# Patient Record
Sex: Female | Born: 2000 | Race: Black or African American | Hispanic: No | Marital: Single | Smoking: Never smoker
Health system: Southern US, Community
[De-identification: ages and names within clinical notes are randomized; demographics above are authoritative.]

## PROBLEM LIST (undated history)

## (undated) DIAGNOSIS — N390 Urinary tract infection, site not specified: Secondary | ICD-10-CM

## (undated) DIAGNOSIS — E079 Disorder of thyroid, unspecified: Secondary | ICD-10-CM

## (undated) DIAGNOSIS — Z8669 Personal history of other diseases of the nervous system and sense organs: Secondary | ICD-10-CM

## (undated) DIAGNOSIS — J302 Other seasonal allergic rhinitis: Secondary | ICD-10-CM

## (undated) HISTORY — DX: Disorder of thyroid, unspecified: E07.9

## (undated) HISTORY — DX: Urinary tract infection, site not specified: N39.0

## (undated) HISTORY — PX: TYMPANOSTOMY TUBE PLACEMENT: SHX32

## (undated) HISTORY — DX: Personal history of other diseases of the nervous system and sense organs: Z86.69

## (undated) HISTORY — PX: TONSILLECTOMY AND ADENOIDECTOMY: SUR1326

## (undated) HISTORY — DX: Other seasonal allergic rhinitis: J30.2

## (undated) HISTORY — PX: HERNIA REPAIR: SHX51

---
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
## 2019-03-31 ENCOUNTER — Other Ambulatory Visit (INDEPENDENT_AMBULATORY_CARE_PROVIDER_SITE_OTHER): Payer: Self-pay | Admitting: *Deleted

## 2019-03-31 DIAGNOSIS — E063 Autoimmune thyroiditis: Secondary | ICD-10-CM

## 2019-04-01 LAB — HEMOGLOBIN A1C
Hgb A1c MFr Bld: 5.1 % of total Hgb (ref <5.7)
Mean Plasma Glucose: 100 (calc)
eAG (mmol/L): 5.5 (calc)

## 2019-04-01 LAB — T4: T4, Total: 11.9 ug/dL — ABNORMAL HIGH (ref 5.3–11.7)

## 2019-04-01 LAB — T4, FREE: Free T4: 1.2 ng/dL (ref 0.8–1.4)

## 2019-04-01 LAB — VITAMIN D 25 HYDROXY (VIT D DEFICIENCY, FRACTURES): Vit D, 25-Hydroxy: 19 ng/mL — ABNORMAL LOW (ref 30–100)

## 2019-04-01 LAB — TSH: TSH: 6.85 mIU/L — ABNORMAL HIGH

## 2019-04-03 ENCOUNTER — Other Ambulatory Visit: Payer: Self-pay

## 2019-04-03 ENCOUNTER — Ambulatory Visit (INDEPENDENT_AMBULATORY_CARE_PROVIDER_SITE_OTHER): Payer: Medicaid Other | Admitting: Pediatric Endocrinology

## 2019-04-03 DIAGNOSIS — E559 Vitamin D deficiency, unspecified: Secondary | ICD-10-CM | POA: Diagnosis not present

## 2019-04-03 DIAGNOSIS — Z3041 Encounter for surveillance of contraceptive pills: Secondary | ICD-10-CM | POA: Diagnosis not present

## 2019-04-03 DIAGNOSIS — E063 Autoimmune thyroiditis: Secondary | ICD-10-CM | POA: Diagnosis not present

## 2019-04-03 DIAGNOSIS — Z3042 Encounter for surveillance of injectable contraceptive: Secondary | ICD-10-CM | POA: Insufficient documentation

## 2019-04-03 MED ORDER — VITAMIN D (ERGOCALCIFEROL) 1.25 MG (50000 UNIT) PO CAPS: 50000.0000 [IU] | ORAL_CAPSULE | ORAL | 0 refills | Status: DC

## 2019-04-03 NOTE — Patient Instructions (Addendum)
Continue Kariva  Start Vit D 1 capsule per week x 12 weeks. Will need levels prior to refill.

## 2019-04-03 NOTE — Progress Notes (Signed)
This is a Pediatric Specialist E-Visit follow up consult provided via WebEx [name] consented to an E-Visit consult today.  Location of patient: Kristina Shaw is at home (location) Location of provider: Reine Just is at office (location) Patient was referred by Kristina Hun, MD   The following participants were involved in this E-Visit: Alexsandra and Dr. Baldo Ash (list of participants and their roles)  Chief Complain/ Reason for E-Visit today: abnormal thyroid function Total time on call: 26 minutes Follow up: 4 months      Subjective:  Subjective  Patient Name: Kristina Shaw Date of Birth: 2000-11-13  MRN: [id number]  [address] [place] today for follow-up evaluation and management of her abnormal thyroid function tests, hypovitamin d and obesity   HISTORY OF PRESENT ILLNESS:   Kristina Shaw is a 18 y.o. AA female   Ray was accompanied by her self (mom in background during first part of visit)   1. [name] was seen by her pcp in august 2014 for her [facility]. At that visit they obtained annual labs which were concerning for TSH of 9.6 with borderline free T4 of 0.81 and a vit D level of 14ng/dL. A1C, cholesterol, and CMP were all normal. She was referred to endocrinology for further evaluation and management of her abnormal blood work. She started Synthroid in September 2014 when TSH was 12.7.  She has a history of very high thyroid antibodies.   2. The patient's last [facility] visit was on 19/8/19. In the interim, she has been generally healthy.   The only medication that she is currently taking is [place]. This is the OCP that we started last fall. She feels that overall her periods are better. She has not noticed weight gain or any adverse reactions.   She is not feeling tired, cold, constipated. She does not think that she has gained weight. She feels that hair and skin are normal.   She finished out the school year online.  Her energy level is ok- but  not as good as when she was at school. She has a hard time being home all the time.   She likes to walk around the block. She does this about every day. At school  She is not doing exercise other than walking around campus for classes.   She has been wearing her apple watch. Her heart rate has been normal. She doesn't have it on today.    Periods are regular on Kariva  She has not been taking synthroid.   3. Pertinent Review of Systems:  Constitutional: The patient feels "great". The patient seems healthy and active. Eyes: having issues with distance vision. Glasses for distance.  Neck: The patient has no complaints of anterior neck swelling, soreness, tenderness, pressure, discomfort, or difficulty swallowing.   Heart: Heart rate increases with exercise or other physical activity. The patient has no complaints of palpitations, irregular heart beats, chest pain, or chest pressure.   Lungs: no asthma or wheezing Gastrointestinal: Bowel movents seem normal. The patient has no complaints of excessive hunger, acid reflux, upset stomach, stomach aches or pains, diarrhea, or constipation.  Legs: Muscle mass and strength seem normal. There are no complaints of numbness, tingling, burning, or pain. No edema is noted.  Feet: There are no obvious foot problems. There are no complaints of numbness, tingling, burning, or pain. No edema is noted. Neurologic: There are no recognized problems with muscle movement and strength, sensation, or coordination. GYN/GU: periods regular.  LMP 5/13  Skin:  no issues  PAST MEDICAL, FAMILY, AND SOCIAL HISTORY  Past Medical History:  Diagnosis Date  . H/O otitis media   . Seasonal allergies     Family History  Problem Relation Age of Onset  . Obesity Mother   . Hyperlipidemia Mother   . Hypertension Maternal Grandmother   . Diabetes Maternal Grandmother   . Allergic rhinitis Father   . Eczema Sister   . Eczema Brother   . Thyroid disease Neg Hx       Current Outpatient Medications:  .  desogestrel-ethinyl estradiol (KARIVA) 0.15-0.02/0.01 MG (21/5) tablet, Take 1 tablet by mouth daily., Disp: 1 Package, Rfl: 11 .  cetirizine (ZYRTEC) 10 MG tablet, Take 1 tablet (10 mg total) by mouth daily. (Patient not taking: Reported on 12/01/2016), Disp: 30 tablet, Rfl: 5 .  diphenhydrAMINE (BENADRYL) 25 mg capsule, Take 25 mg by mouth every 6 (six) hours as needed., Disp: , Rfl:  .  hydrocortisone 2.5 % ointment, APPLY TO AFFECTED AREA TWICE A DAY X14 DAYS, Disp: , Rfl:  .  levothyroxine (SYNTHROID, LEVOTHROID) 50 MCG tablet, Take 1 tablet (50 mcg total) by mouth daily. (Patient not taking: Reported on 12/07/2017), Disp: 30 tablet, Rfl: 12 .  mometasone (NASONEX) 50 MCG/ACT nasal spray, Place 2 sprays into the nose daily. Two sprays each in each nostril (Patient not taking: Reported on 12/01/2016), Disp: 17 g, Rfl: 5 .  Olopatadine HCl (PAZEO) 0.7 % SOLN, Place 1 drop into both eyes 1 day or 1 dose. (Patient not taking: Reported on 12/01/2016), Disp: 1 Bottle, Rfl: 5 .  triamcinolone cream (KENALOG) 0.1 %, Apply twice a day to affected areas, Disp: , Rfl:  .  Vitamin D, Ergocalciferol, (DRISDOL) 1.25 MG (50000 UT) CAPS capsule, Take 1 capsule (50,000 Units total) by mouth every 7 (seven) days. (Patient not taking: Reported on 04/03/2019), Disp: 12 capsule, Rfl: 0  Allergies as of 04/03/2019  . (No Known Allergies)     reports that she is a non-smoker but has been exposed to tobacco smoke. She has never used smokeless tobacco. She reports that she does not drink alcohol or use drugs. Pediatric History  Patient Parents/Guardians  . Dannenberg,Cassandra (Mother/Guardian)   Other Topics Concern  . 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   Primary Care Provider: Samantha CrimesArtis, Daniellee L, MD  ROS: There are no other  significant problems involving Kristina Shaw's other body systems.    Objective:  Objective  Vital Signs:  Home weight 156 pounds.   There were no vitals taken for this visit.    Ht Readings from Last 3 Encounters:  08/02/18 5' 3.98" (1.625 m) (46 %, Z= -0.11)*  12/07/17 5' 4.37" (1.635 m) (52 %, Z= 0.06)*  08/09/17 5' 4.17" (1.63 m) (50 %, Z= -0.01)*   * Growth percentiles are based on CDC (Girls, 2-20 Years) data.   Wt Readings from Last 3 Encounters:  08/02/18 156 lb 12.8 oz (71.1 kg) (87 %, Z= 1.13)*  12/07/17 162 lb 8 oz (73.7 kg) (91 %, Z= 1.33)*  08/09/17 158 lb 9.6 oz (71.9 kg) (90 %, Z= 1.25)*   * Growth percentiles are based on CDC (Girls, 2-20 Years) data.   HC Readings from Last 3 Encounters:  [facility]   There is no height or weight on file to  calculate BSA. No height on file for this encounter. No weight on file for this encounter.    PHYSICAL EXAM:  Virtual visit  Patient appears healthy and well nourished.  Normal oral moisture Normal work of breathing.  No noted goiter No tremor Normal movement of extremities.   LAB DATA:   Results for orders placed or performed in visit on 03/31/19 (from the past 672 hour(s))  VITAMIN D 25 Hydroxy (Vit-D Deficiency, Fractures)   Collection Time: 03/31/19  3:28 PM  Result Value Ref Range   Vit D, 25-Hydroxy 19 (L) 30 - 100 ng/mL  Hemoglobin A1c   Collection Time: 03/31/19  3:28 PM  Result Value Ref Range   Hgb A1c MFr Bld 5.1 <5.7 % of total Hgb   Mean Plasma Glucose 100 (calc)   eAG (mmol/L) 5.5 (calc)  T4   Collection Time: 03/31/19  3:28 PM  Result Value Ref Range   T4, Total 11.9 (H) 5.3 - 11.7 mcg/dL  TSH   Collection Time: 03/31/19  3:28 PM  Result Value Ref Range   TSH 6.85 (H) mIU/L  T4, free   Collection Time: 03/31/19  3:28 PM  Result Value Ref Range   Free T4 1.2 0.8 - 1.4 ng/dL      Assessment and Plan:  Assessment  ASSESSMENT:  [name] is a 18 y.o. 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    Thyroid - she has antibodies for both hypo and hyper thyroidism - she has been clinically euthyroid - She has been hyperthyroid on labs- but is currently not  - TSH now above target but with normal free T4 (total T4 is high due to OCP use) - thyroid imaging has been normal.  - she is no longer having tachycardia or hot flashes  Hypovitaminosis D - history of low levels - not currently taking supplement - level low on current labs - will restart 50K IU/week  Surveillance of OCP use - continues on Kariva - no issues   PLAN:  1. Diagnostic: CMP, CBC and TFTs Vit D as above. Repeat for next visit.  2. Therapeutic: Kariva ocp. Vit D x 12 doses (no refills) 3. Patient education: Lengthy discussion of the above. Simar to call if concerns prior to next visit. May need to have labs and webex visit from school this fall ([employer]) 4. Follow-up: Return in about 4 months (around 08/03/2019).      Dessa PhiJennifer Spirit Wernli, MD  Level of Service: This visit lasted in excess of 25 minutes. More than 50% of the visit was devoted to counseling.

## 2019-07-04 ENCOUNTER — Other Ambulatory Visit (INDEPENDENT_AMBULATORY_CARE_PROVIDER_SITE_OTHER): Payer: Self-pay | Admitting: Pediatric Endocrinology

## 2019-07-04 DIAGNOSIS — E063 Autoimmune thyroiditis: Secondary | ICD-10-CM

## 2019-07-04 DIAGNOSIS — E0590 Thyrotoxicosis, unspecified without thyrotoxic crisis or storm: Secondary | ICD-10-CM

## 2019-07-04 DIAGNOSIS — E559 Vitamin D deficiency, unspecified: Secondary | ICD-10-CM

## 2019-07-04 DIAGNOSIS — E0500 Thyrotoxicosis with diffuse goiter without thyrotoxic crisis or storm: Secondary | ICD-10-CM

## 2019-07-04 NOTE — Telephone Encounter (Signed)
Call to patient advised rx not refilled for vitamin D needs to have labs drawn prior to refill. Labs orders entered and she agrees to have them drawn.

## 2019-07-13 LAB — COMPREHENSIVE METABOLIC PANEL
AG Ratio: 1.4 (calc) (ref 1.0–2.5)
ALT: 8 U/L (ref 5–32)
AST: 13 U/L (ref 12–32)
Albumin: 4 g/dL (ref 3.6–5.1)
Alkaline phosphatase (APISO): 47 U/L (ref 36–128)
BUN: 9 mg/dL (ref 7–20)
CO2: 24 mmol/L (ref 20–32)
Calcium: 9.2 mg/dL (ref 8.9–10.4)
Chloride: 104 mmol/L (ref 98–110)
Creat: 0.74 mg/dL (ref 0.50–1.00)
Globulin: 2.9 g/dL (calc) (ref 2.0–3.8)
Glucose, Bld: 81 mg/dL (ref 65–99)
Potassium: 4.2 mmol/L (ref 3.8–5.1)
Sodium: 138 mmol/L (ref 135–146)
Total Bilirubin: 0.4 mg/dL (ref 0.2–1.1)
Total Protein: 6.9 g/dL (ref 6.3–8.2)

## 2019-07-13 LAB — CBC WITH DIFFERENTIAL/PLATELET
Absolute Monocytes: 540 cells/uL (ref 200–950)
Basophils Absolute: 57 cells/uL (ref 0–200)
Basophils Relative: 0.8 %
Eosinophils Absolute: 341 cells/uL (ref 15–500)
Eosinophils Relative: 4.8 %
HCT: 38.6 % (ref 35.0–45.0)
Hemoglobin: 12.3 g/dL (ref 11.7–15.5)
Lymphs Abs: 3188 cells/uL (ref 850–3900)
MCH: 25.7 pg — ABNORMAL LOW (ref 27.0–33.0)
MCHC: 31.9 g/dL — ABNORMAL LOW (ref 32.0–36.0)
MCV: 80.6 fL (ref 80.0–100.0)
MPV: 9.5 fL (ref 7.5–12.5)
Monocytes Relative: 7.6 %
Neutro Abs: 2975 cells/uL (ref 1500–7800)
Neutrophils Relative %: 41.9 %
Platelets: 296 10*3/uL (ref 140–400)
RBC: 4.79 10*6/uL (ref 3.80–5.10)
RDW: 13.8 % (ref 11.0–15.0)
Total Lymphocyte: 44.9 %
WBC: 7.1 10*3/uL (ref 3.8–10.8)

## 2019-07-13 LAB — T4: T4, Total: 11.5 ug/dL (ref 5.3–11.7)

## 2019-07-13 LAB — VITAMIN D 25 HYDROXY (VIT D DEFICIENCY, FRACTURES): Vit D, 25-Hydroxy: 54 ng/mL (ref 30–100)

## 2019-07-13 LAB — T4, FREE: Free T4: 1 ng/dL (ref 0.8–1.4)

## 2019-07-13 LAB — TSH: TSH: 9.87 mIU/L — ABNORMAL HIGH

## 2019-07-27 ENCOUNTER — Other Ambulatory Visit (INDEPENDENT_AMBULATORY_CARE_PROVIDER_SITE_OTHER): Payer: Self-pay | Admitting: Pediatric Endocrinology

## 2019-08-08 ENCOUNTER — Ambulatory Visit (INDEPENDENT_AMBULATORY_CARE_PROVIDER_SITE_OTHER): Payer: Medicaid Other | Admitting: Pediatric Endocrinology

## 2019-08-09 ENCOUNTER — Other Ambulatory Visit: Payer: Self-pay

## 2019-08-09 ENCOUNTER — Encounter (INDEPENDENT_AMBULATORY_CARE_PROVIDER_SITE_OTHER): Payer: Self-pay | Admitting: Pediatric Endocrinology

## 2019-08-09 ENCOUNTER — Ambulatory Visit (INDEPENDENT_AMBULATORY_CARE_PROVIDER_SITE_OTHER): Payer: Medicaid Other | Admitting: Pediatric Endocrinology

## 2019-08-09 VITALS — BP 114/72 | HR 80 | Ht 63.78 in | Wt 162.8 lb

## 2019-08-09 DIAGNOSIS — Z3041 Encounter for surveillance of contraceptive pills: Secondary | ICD-10-CM

## 2019-08-09 DIAGNOSIS — E063 Autoimmune thyroiditis: Secondary | ICD-10-CM | POA: Diagnosis not present

## 2019-08-09 DIAGNOSIS — N926 Irregular menstruation, unspecified: Secondary | ICD-10-CM

## 2019-08-09 DIAGNOSIS — E559 Vitamin D deficiency, unspecified: Secondary | ICD-10-CM | POA: Diagnosis not present

## 2019-08-09 NOTE — Progress Notes (Signed)
Subjective:  Subjective  Patient Name: Kristina Shaw Date of Birth: 2000-10-28  MRN: [id number]  Kristina Shaw  Presents to clinic today for follow-up evaluation and management of her abnormal thyroid function tests, hypovitamin d and obesity   HISTORY OF PRESENT ILLNESS:   Kristina Shaw is a 18 y.o. AA female   Chalfant was accompanied by her self   1. Willetta was seen by her pcp in august 2014 for her [facility] At that visit they obtained annual labs which were concerning for TSH of 9.6 with borderline free T4 of 0.81 and a vit D level of 14ng/dL. A1C, cholesterol, and CMP were all normal. She was referred to endocrinology for further evaluation and management of her abnormal blood work. She started Synthroid in September 2014 when TSH was 12.7.  She has a history of very high thyroid antibodies.   2. The patient's last [facility] visit was on 04/03/19. In the interim, she has been generally healthy.   She has been feeling fine. She doesn't really like virtual classes.   She has continued on Kariva. She is doing well on it. Mom thinks that maybe they should change to a non-hormonal birth control because she is worried that it is impacting Kristina Shaw's thyroid numbers. Kristina Shaw doesn't want to restart synthroid.   She is eating broccoli almost every day. She is also eating a lot of other vegetables and foods   She has been feeling more tired than usual. She thinks that it is because she stays up late studying. She is feeling hot overall. She has not had constipation or diarrhea. She has gained some weight. No changes with hair or skin. She feels that she is sleeping well. She is no longer having palpitations.   She is walking some. She is walking while her sister is learning to bike. She and her BF are thinking about getting a gym membership.   Periods are regular on Kariva   She has not been taking synthroid.   3. Pertinent Review of Systems:  Constitutional: The patient feels "good". The  patient seems healthy and active. Eyes: having issues with distance vision. Glasses for distance.  Neck: The patient has no complaints of anterior neck swelling, soreness, tenderness, pressure, discomfort, or difficulty swallowing.   Heart: Heart rate increases with exercise or other physical activity. The patient has no complaints of palpitations, irregular heart beats, chest pain, or chest pressure.   Lungs: no asthma or wheezing Gastrointestinal: Bowel movents seem normal. The patient has no complaints of excessive hunger, acid reflux, upset stomach, stomach aches or pains, diarrhea, or constipation.  Legs: Muscle mass and strength seem normal. There are no complaints of numbness, tingling, burning, or pain. No edema is noted.  Feet: There are no obvious foot problems. There are no complaints of numbness, tingling, burning, or pain. No edema is noted. Neurologic: There are no recognized problems with muscle movement and strength, sensation, or coordination. GYN/GU: periods regular.  LMP about 2 weeks ago.  Skin: no issues  PAST MEDICAL, FAMILY, AND SOCIAL HISTORY  Past Medical History:  Diagnosis Date  . H/O otitis media   . Seasonal allergies     Family History  Problem Relation Age of Onset  . Obesity Mother   . Hyperlipidemia Mother   . Hypertension Maternal Grandmother   . Diabetes Maternal Grandmother   . Allergic rhinitis Father   . Eczema Sister   . Eczema Brother   . Thyroid disease Neg Hx  Current Outpatient Medications:  .  cetirizine (ZYRTEC) 10 MG tablet, Take 1 tablet (10 mg total) by mouth daily., Disp: 30 tablet, Rfl: 5 .  KARIVA 0.15-0.02/0.01 MG (21/5) tablet, TAKE 1 TABLET BY MOUTH EVERY DAY, Disp: 28 tablet, Rfl: 6 .  diphenhydrAMINE (BENADRYL) 25 mg capsule, Take 25 mg by mouth every 6 (six) hours as needed., Disp: , Rfl:  .  hydrocortisone 2.5 % ointment, APPLY TO AFFECTED AREA TWICE A DAY X14 DAYS, Disp: , Rfl:  .  levothyroxine (SYNTHROID,  LEVOTHROID) 50 MCG tablet, Take 1 tablet (50 mcg total) by mouth daily. (Patient not taking: Reported on 12/07/2017), Disp: 30 tablet, Rfl: 12 .  mometasone (NASONEX) 50 MCG/ACT nasal spray, Place 2 sprays into the nose daily. Two sprays each in each nostril (Patient not taking: Reported on 12/01/2016), Disp: 17 g, Rfl: 5 .  Olopatadine HCl (PAZEO) 0.7 % SOLN, Place 1 drop into both eyes 1 day or 1 dose. (Patient not taking: Reported on 12/01/2016), Disp: 1 Bottle, Rfl: 5 .  triamcinolone cream (KENALOG) 0.1 %, Apply twice a day to affected areas, Disp: , Rfl:  .  Vitamin D, Ergocalciferol, (DRISDOL) 1.25 MG (50000 UT) CAPS capsule, Take 1 capsule (50,000 Units total) by mouth every 7 (seven) days. (Patient not taking: Reported on 04/03/2019), Disp: 12 capsule, Rfl: 0  Allergies as of 08/09/2019  . (No Known Allergies)     reports that she is a non-smoker but has been exposed to tobacco smoke. She has never used smokeless tobacco. She reports that she does not drink alcohol or use drugs. Pediatric History  Patient Parents/Guardians  . Devall,Cassandra (Mother/Guardian)   Other Topics Concern  . 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   Primary Care Provider: Samantha Crimes, MD  ROS: There are no other significant problems involving Jilleen's other body systems.    Objective:  Objective  Vital Signs:  Home weight last visit 156 pounds.   BP 114/72   Pulse 80   Ht 5' 3.78" (1.62 m)   Wt 162 lb 12.8 oz (73.8 kg)   LMP 07/26/2019   BMI 28.14 kg/m     Ht Readings from Last 3 Encounters:  08/09/19 5' 3.78" (1.62 m) (42 %, Z= -0.20)*  08/02/18 5' 3.98" (1.625 m) (46 %, Z= -0.11)*  12/07/17 5' 4.37" (1.635 m) (52 %, Z= 0.06)*   * Growth percentiles are based on CDC (Girls, 2-20 Years) data.   Wt Readings from Last 3 Encounters:  08/09/19 162 lb 12.8 oz (73.8  kg) (89 %, Z= 1.21)*  08/02/18 156 lb 12.8 oz (71.1 kg) (87 %, Z= 1.13)*  12/07/17 162 lb 8 oz (73.7 kg) (91 %, Z= 1.33)*   * Growth percentiles are based on CDC (Girls, 2-20 Years) data.   HC Readings from Last 3 Encounters:  No data found for [facility]   Body surface area is 1.82 meters squared. 42 %ile (Z= -0.20) based on CDC (Girls, 2-20 Years) Stature-for-age data based on Stature recorded on 08/09/2019. 89 %ile (Z= 1.21) based on CDC (Girls, 2-20 Years) weight-for-age data using vitals from 08/09/2019.    PHYSICAL EXAM: Constitutional: The patient appears healthy and well nourished. She has gained weight since last visit.  Head: The head is normocephalic. No areas of alopecia noted.  Face: The face appears normal. There are  no obvious dysmorphic features. Eyes: The eyes appear to be normally formed and spaced. Gaze is conjugate. There is no obvious arcus or proptosis. Moisture appears normal. Ears: The ears are normally placed and appear externally normal. Mouth: The oropharynx and tongue appear normal. Dentition appears to be normal for age. Oral moisture is normal. Neck: The neck appears to be visibly normal. The thyroid gland is 14 grams in size. The consistency of the thyroid gland is normal. The thyroid gland is not tender to palpation. Lungs: The lungs are clear to auscultation. Air movement is good. Heart: Heart rate and rhythm are regular. Heart sounds S1 and S2 are normal. I did not appreciate any pathologic cardiac murmurs. Abdomen: The abdomen appears to be normal in size for the patient's age. Bowel sounds are normal. There is no obvious hepatomegaly, splenomegaly, or other mass effect.  Arms: Muscle size and bulk are normal for age. Hands: There is no obvious tremor. Phalangeal and metacarpophalangeal joints are normal. Palmar muscles are normal for age. Palmar skin is normal. Palmar moisture is also normal. Legs: Muscles appear normal for age. No edema is present. Feet: Feet  are normally formed. Dorsalis pedal pulses are normal. Neurologic: Strength is normal for age in both the upper and lower extremities. Muscle tone is normal. Sensation to touch is normal in both the legs and feet.   GYN/GU: normal   LAB DATA:    Results for orders placed or performed in visit on 07/04/19 (from the past 672 hour(s))  Vitamin D (25 hydroxy)   Collection Time: 07/12/19 11:35 AM  Result Value Ref Range   Vit D, 25-Hydroxy 54 30 - 100 ng/mL  T4   Collection Time: 07/12/19 11:35 AM  Result Value Ref Range   T4, Total 11.5 5.3 - 11.7 mcg/dL  TSH   Collection Time: 07/12/19 11:35 AM  Result Value Ref Range   TSH 9.87 (H) mIU/L  Comprehensive metabolic panel   Collection Time: 07/12/19 11:35 AM  Result Value Ref Range   Glucose, Bld 81 65 - 99 mg/dL   BUN 9 7 - 20 mg/dL   Creat 1.61 0.96 - 0.45 mg/dL   BUN/Creatinine Ratio NOT APPLICABLE 6 - 22 (calc)   Sodium 138 135 - 146 mmol/L   Potassium 4.2 3.8 - 5.1 mmol/L   Chloride 104 98 - 110 mmol/L   CO2 24 20 - 32 mmol/L   Calcium 9.2 8.9 - 10.4 mg/dL   Total Protein 6.9 6.3 - 8.2 g/dL   Albumin 4.0 3.6 - 5.1 g/dL   Globulin 2.9 2.0 - 3.8 g/dL (calc)   AG Ratio 1.4 1.0 - 2.5 (calc)   Total Bilirubin 0.4 0.2 - 1.1 mg/dL   Alkaline phosphatase (APISO) 47 36 - 128 U/L   AST 13 12 - 32 U/L   ALT 8 5 - 32 U/L  CBC with Differential/Platelet   Collection Time: 07/12/19 11:35 AM  Result Value Ref Range   WBC 7.1 3.8 - 10.8 Thousand/uL   RBC 4.79 3.80 - 5.10 Million/uL   Hemoglobin 12.3 11.7 - 15.5 g/dL   HCT 40.9 81.1 - 91.4 %   MCV 80.6 80.0 - 100.0 fL   MCH 25.7 (L) 27.0 - 33.0 pg   MCHC 31.9 (L) 32.0 - 36.0 g/dL   RDW 78.2 95.6 - 21.3 %   Platelets 296 140 - 400 Thousand/uL   MPV 9.5 7.5 - 12.5 fL   Neutro Abs 2,975 1,500 - 7,800 cells/uL   Lymphs  Abs 3,188 850 - 3,900 cells/uL   Absolute Monocytes 540 200 - 950 cells/uL   Eosinophils Absolute 341 15 - 500 cells/uL   Basophils Absolute 57 0 - 200 cells/uL    Neutrophils Relative % 41.9 %   Total Lymphocyte 44.9 %   Monocytes Relative 7.6 %   Eosinophils Relative 4.8 %   Basophils Relative 0.8 %  T4, free   Collection Time: 07/12/19 11:35 AM  Result Value Ref Range   Free T4 1.0 0.8 - 1.4 ng/dL      Assessment and Plan:  Assessment  ASSESSMENT:  [name] is a 18 y.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    Thyroid - she has antibodies for both hypo and hyper thyroidism - she has been clinically euthyroid- possibly starting to feel more hypothyroid.  - She is very resistant to starting LT4 as she has been very sensitive to low dose therapy in the past - She has shown subclinical hypothyroidism on her last 2 sets of labs - thyroid imaging has been normal.  - she is no longer having tachycardia or hot flashes - She would like to try non-hormonal birth control as her mom read something about OCPs affecting thyroid function. (they increase total T4- but don't really impact TSH- she still wanted to try this) - discussed goitrogenic foods  Hypovitaminosis D - history of low levels - replete on most recent labs  Surveillance of OCP use - continues on Kariva - Referral placed to adolescent medicine for possible IUD placement.   PLAN:  1. Diagnostic: CMP, CBC and TFTs Vit D as above. Repeat TFTs in 1 month. Repeat again for next visit.   2. Therapeutic: Kariva ocp.  3. Patient education: Lengthy discussion of the above. [place]onia to call if concerns prior to next visit. Referral placed to adolescent medicine 4. Follow-up: Return in about 3 months (around 11/09/2019).      Dessa PhiJennifer Zaniah Titterington, MD Level of Service: This visit lasted in excess of 25 minutes. More than 50% of the visit was devoted to counseling.

## 2019-08-09 NOTE — Patient Instructions (Addendum)
Limit Goitrogenic foods (ie soy, cruciferous vegetables, some fruits, some nuts) but do not eliminate!   I will put in lab orders today- please come back in [facility] November and have the labs drawn.   I have put in a referral to adolescent med - they should call you to schedule.

## 2019-08-22 ENCOUNTER — Ambulatory Visit (INDEPENDENT_AMBULATORY_CARE_PROVIDER_SITE_OTHER): Payer: Medicaid Other | Admitting: Family

## 2019-08-22 ENCOUNTER — Encounter: Payer: Self-pay | Admitting: Family

## 2019-08-22 DIAGNOSIS — Z3009 Encounter for other general counseling and advice on contraception: Secondary | ICD-10-CM | POA: Diagnosis not present

## 2019-08-22 DIAGNOSIS — E063 Autoimmune thyroiditis: Secondary | ICD-10-CM | POA: Diagnosis not present

## 2019-08-22 DIAGNOSIS — R519 Headache, unspecified: Secondary | ICD-10-CM | POA: Diagnosis not present

## 2019-08-22 DIAGNOSIS — H539 Unspecified visual disturbance: Secondary | ICD-10-CM

## 2019-08-22 DIAGNOSIS — G8929 Other chronic pain: Secondary | ICD-10-CM
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
## 2019-08-24 ENCOUNTER — Encounter: Payer: Self-pay | Admitting: Family

## 2019-08-24 ENCOUNTER — Telehealth: Payer: Self-pay

## 2019-08-24 NOTE — Telephone Encounter (Signed)
Sent pt MyChart message.

## 2019-08-24 NOTE — Progress Notes (Signed)
Supervising Provider Co-Signature  I reviewed with the resident the medical history and the resident's findings.  I discussed with the resident the patient's diagnosis and concur with the treatment plan as documented in the resident's note. Will test prolactin level to rule out pituitary adenoma. Recommend vision screening and headache log.  [name], NP

## 2019-08-24 NOTE — Telephone Encounter (Signed)
-----   Message from [name], NP sent at 08/24/2019  9:36 AM EDT ----- Regarding: future lab, vision screening, headache log I put in an order for prolactin; we will want to check that lab since she is complaining of headaches and vision changes. When she returns for lab, will you screen her vision and also recommend that she keep a log of her headaches? Thank you

## 2019-08-29 NOTE — Progress Notes (Signed)
Attending Co-Signature.   I reviewed the history and physical exam with the nurse practitioner and participated in development of the assessment and management plan as documented.    Gaspar Skeeters, MD Adolescent Medicine Specialist

## 2019-08-30 ENCOUNTER — Telehealth: Payer: Self-pay | Admitting: Pediatrics

## 2019-08-30 NOTE — Telephone Encounter (Signed)

## 2019-08-31 ENCOUNTER — Other Ambulatory Visit: Payer: Self-pay

## 2019-08-31 ENCOUNTER — Ambulatory Visit: Payer: Medicaid Other

## 2019-08-31 ENCOUNTER — Ambulatory Visit (INDEPENDENT_AMBULATORY_CARE_PROVIDER_SITE_OTHER): Payer: Medicaid Other

## 2019-08-31 DIAGNOSIS — Z3049 Encounter for surveillance of other contraceptives: Secondary | ICD-10-CM | POA: Diagnosis not present

## 2019-08-31 DIAGNOSIS — E063 Autoimmune thyroiditis: Secondary | ICD-10-CM

## 2019-08-31 DIAGNOSIS — Z3202 Encounter for pregnancy test, result negative: Secondary | ICD-10-CM

## 2019-08-31 DIAGNOSIS — H539 Unspecified visual disturbance: Secondary | ICD-10-CM

## 2019-08-31 DIAGNOSIS — Z3042 Encounter for surveillance of injectable contraceptive: Secondary | ICD-10-CM

## 2019-08-31 DIAGNOSIS — R519 Headache, unspecified: Secondary | ICD-10-CM

## 2019-08-31 LAB — POCT URINE PREGNANCY: Preg Test, Ur: NEGATIVE

## 2019-08-31 MED ORDER — MEDROXYPROGESTERONE ACETATE 150 MG/ML IM SUSP
150.0000 mg | Freq: Once | INTRAMUSCULAR | Status: AC
  Administered 2019-08-31: 150 mg via INTRAMUSCULAR

## 2019-08-31 NOTE — Addendum Note (Signed)
Addended by: Jason Fila on: 08/31/2019 09:39 AM   Modules accepted: Orders

## 2019-08-31 NOTE — Progress Notes (Signed)
Pt presents for depo injection. Urine hcg negative. Injection given, tolerated well. F/u depo injection visit scheduled. Labs also completed today as well as vision screening. 20/20 with correction.

## 2019-08-31 NOTE — Addendum Note (Signed)
Addended by: Jason Fila on: 08/31/2019 08:48 AM   Modules accepted: Orders

## 2019-09-01 LAB — T4, FREE: Free T4: 1 ng/dL (ref 0.8–1.4)

## 2019-09-01 LAB — EXTRA LAV TOP TUBE

## 2019-09-01 LAB — PROLACTIN: Prolactin: 17.4 ng/mL

## 2019-09-01 LAB — TSH+FREE T4: TSH W/REFLEX TO FT4: 17.62 mIU/L — ABNORMAL HIGH

## 2019-09-06 ENCOUNTER — Other Ambulatory Visit: Payer: Self-pay | Admitting: Family

## 2019-09-12 ENCOUNTER — Encounter (INDEPENDENT_AMBULATORY_CARE_PROVIDER_SITE_OTHER): Payer: Self-pay

## 2019-09-20 ENCOUNTER — Other Ambulatory Visit (INDEPENDENT_AMBULATORY_CARE_PROVIDER_SITE_OTHER): Payer: Self-pay | Admitting: *Deleted

## 2019-09-20 DIAGNOSIS — E063 Autoimmune thyroiditis: Secondary | ICD-10-CM

## 2019-09-20 MED ORDER — LEVOTHYROXINE SODIUM 50 MCG PO TABS: ORAL_TABLET | ORAL | 5 refills | Status: DC

## 2019-11-09 ENCOUNTER — Encounter (INDEPENDENT_AMBULATORY_CARE_PROVIDER_SITE_OTHER): Payer: Self-pay

## 2019-11-10 LAB — TSH: TSH: 14.86 mIU/L — ABNORMAL HIGH

## 2019-11-10 LAB — T4, FREE: Free T4: 0.9 ng/dL (ref 0.8–1.4)

## 2019-11-13 ENCOUNTER — Ambulatory Visit (INDEPENDENT_AMBULATORY_CARE_PROVIDER_SITE_OTHER): Payer: Medicaid Other

## 2019-11-13 ENCOUNTER — Other Ambulatory Visit: Payer: Self-pay

## 2019-11-13 DIAGNOSIS — Z3049 Encounter for surveillance of other contraceptives: Secondary | ICD-10-CM

## 2019-11-13 DIAGNOSIS — Z3042 Encounter for surveillance of injectable contraceptive: Secondary | ICD-10-CM

## 2019-11-13 MED ORDER — MEDROXYPROGESTERONE ACETATE 150 MG/ML IM SUSP
150.0000 mg | Freq: Once | INTRAMUSCULAR | Status: AC
  Administered 2019-11-13: 09:00:00 150 mg via INTRAMUSCULAR

## 2019-11-13 NOTE — Progress Notes (Signed)
Pt presents for depo injection. Pt within depo window, no urine hcg needed. Injection given, tolerated well. F/u depo injection visit scheduled.   

## 2019-11-14 ENCOUNTER — Ambulatory Visit (INDEPENDENT_AMBULATORY_CARE_PROVIDER_SITE_OTHER): Payer: Medicaid Other | Admitting: Pediatric Endocrinology

## 2019-11-16 ENCOUNTER — Ambulatory Visit: Payer: Medicaid Other

## 2019-11-30 ENCOUNTER — Encounter (INDEPENDENT_AMBULATORY_CARE_PROVIDER_SITE_OTHER): Payer: Self-pay | Admitting: Pediatric Endocrinology

## 2019-11-30 ENCOUNTER — Ambulatory Visit (INDEPENDENT_AMBULATORY_CARE_PROVIDER_SITE_OTHER): Payer: Medicaid Other | Admitting: Pediatric Endocrinology

## 2019-11-30 ENCOUNTER — Other Ambulatory Visit: Payer: Self-pay

## 2019-11-30 DIAGNOSIS — E063 Autoimmune thyroiditis: Secondary | ICD-10-CM | POA: Diagnosis not present

## 2019-11-30 MED ORDER — LEVOTHYROXINE SODIUM 50 MCG PO TABS: 50.0000 ug | ORAL_TABLET | Freq: Every day | ORAL | 5 refills | Status: DC

## 2019-11-30 NOTE — Patient Instructions (Addendum)
Increase Synthroid to 50 mcg daily.   Labs for next visit.   If issues before next visit- please let me know.  Lab orders are in for [facility].

## 2019-11-30 NOTE — Progress Notes (Signed)
Subjective:  Subjective  Patient Name: Kristina Shaw Date of Birth: [birth year]  MRN: [id number]  Kristina Shaw  Presents to clinic today for follow-up evaluation and management of her abnormal thyroid function tests, hypovitamin d and obesity   HISTORY OF PRESENT ILLNESS:   Kristina Shaw is a 19 y.o. AA female   Kristina Shaw was accompanied by her self   1. Kristina Shaw was seen by her pcp in august 2014 for her [facility]. At that visit they obtained annual labs which were concerning for TSH of 9.6 with borderline free T4 of 0.81 and a vit D level of 14ng/dL. A1C, cholesterol, and CMP were all normal. She was referred to endocrinology for further evaluation and management of her abnormal blood work. She started Synthroid in September 2014 when TSH was 12.7.  She has a history of very high thyroid antibodies.   2. The patient's last [facility] visit was on 08/09/19. In the interim, she has been generally healthy.   After her last visit we started her on 25 mcg of synthroid. She hasn't noticed a lot of changes.   She feels that her appetite has decreased. She is eating about once a day.   She is only drinking water and no sugary drinks.   She is frustrated that she is not at school where she would have access to the gym. She is doing virtual school.   She went to adolescent medicine and is now using Depot Provera instead of OCP. She has no period on the Depot and otherwise feels the same.   She has continued to feel hot/warm. When she first got the shot she had a lot of emotional dysregulation but did not feel hot. She is feeling hot now.   She is feeling hot when everyone else is cold. She used to be the cold one.   She is feeling jittery. She is having a lot more anxiety.   She feels that her stools are regular. She denies constipation or diarrhea.   She feels that she does not have any energy. When she first started the Synthroid she was having trouble sleeping but now she sleeps all the time.    She is nervous about going up quickly on her Synthroid dose. She would like to go up in very small steps.   3. Pertinent Review of Systems:  Constitutional: The patient feels "tired". The patient seems healthy and active. She just woke up to come to clinic this afternoon.  Eyes: having issues with distance vision. Glasses for distance.  Neck: The patient has no complaints of anterior neck swelling, soreness, tenderness, pressure, discomfort, or difficulty swallowing.   Heart: Heart rate increases with exercise or other physical activity. The patient has no complaints of palpitations, irregular heart beats, chest pain, or chest pressure.   Lungs: no asthma or wheezing Gastrointestinal: Bowel movents seem normal. The patient has no complaints of excessive hunger, acid reflux, upset stomach, stomach aches or pains, diarrhea, or constipation.  Legs: Muscle mass and strength seem normal. There are no complaints of numbness, tingling, burning, or pain. No edema is noted.  Feet: There are no obvious foot problems. There are no complaints of numbness, tingling, burning, or pain. No edema is noted. Neurologic: There are no recognized problems with muscle movement and strength, sensation, or coordination. GYN/GU: Now on depot provera- no menses.  Skin: no issues  PAST MEDICAL, FAMILY, AND SOCIAL HISTORY  Past Medical History:  Diagnosis Date  . H/O otitis media   .  Seasonal allergies     Family History  Problem Relation Age of Onset  . Obesity Mother   . Hyperlipidemia Mother   . Hypertension Maternal Grandmother   . Diabetes Maternal Grandmother   . Allergic rhinitis Father   . Eczema Sister   . Eczema Brother   . Thyroid disease Neg Hx      Current Outpatient Medications:  .  cetirizine (ZYRTEC) 10 MG tablet, Take 1 tablet (10 mg total) by mouth daily., Disp: 30 tablet, Rfl: 5 .  hydrocortisone 2.5 % ointment, APPLY TO AFFECTED AREA TWICE A DAY X14 DAYS, Disp: , Rfl:  .   levothyroxine (SYNTHROID) 50 MCG tablet, Take 1 tablet (50 mcg total) by mouth daily., Disp: 30 tablet, Rfl: 5 .  diphenhydrAMINE (BENADRYL) 25 mg capsule, Take 25 mg by mouth every 6 (six) hours as needed., Disp: , Rfl:  .  KARIVA 0.15-0.02/0.01 MG (21/5) tablet, TAKE 1 TABLET BY MOUTH EVERY DAY (Patient not taking: Reported on 11/30/2019), Disp: 28 tablet, Rfl: 6 .  mometasone (NASONEX) 50 MCG/ACT nasal spray, Place 2 sprays into the nose daily. Two sprays each in each nostril (Patient not taking: Reported on 12/01/2016), Disp: 17 g, Rfl: 5 .  Olopatadine HCl (PAZEO) 0.7 % SOLN, Place 1 drop into both eyes 1 day or 1 dose. (Patient not taking: Reported on 12/01/2016), Disp: 1 Bottle, Rfl: 5 .  triamcinolone cream (KENALOG) 0.1 %, Apply twice a day to affected areas, Disp: , Rfl:  .  Vitamin D, Ergocalciferol, (DRISDOL) 1.25 MG (50000 UT) CAPS capsule, Take 1 capsule (50,000 Units total) by mouth every 7 (seven) days. (Patient not taking: Reported on 04/03/2019), Disp: 12 capsule, Rfl: 0  Allergies as of 11/30/2019  . (No Known Allergies)     reports that she is a non-smoker but has been exposed to tobacco smoke. She has never used smokeless tobacco. She reports that she does not drink alcohol or use drugs. Pediatric History  Patient Parents/Guardians  . Gattuso,Cassandra (Mother/Guardian)   Other Topics Concern  . Not on file  Social History Narrative   Lives with mom, dad, 1 sister, 1 brother   Dance and theater   [place]. 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   Primary Care Provider: Kirkland Hun, MD  ROS: There are no other significant problems involving Kristina Shaw's other body systems.    Objective:  Objective  Vital Signs:    BP 114/74   Pulse 80   Ht 5' 4.17" (1.63 m)   Wt 147 lb 9.6 oz (67 kg)   BMI 25.20 kg/m     Ht Readings from Last 3 Encounters:  11/30/19 5' 4.17" (1.63 m)  08/09/19 5' 3.78" (1.62 m) (42 %, Z= -0.20)*  08/02/18 5' 3.98" (1.625 m) (46 %, Z= -0.11)*    * Growth percentiles are based on CDC (Girls, 2-20 Years) data.   Wt Readings from Last 3 Encounters:  11/30/19 147 lb 9.6 oz (67 kg)  08/09/19 162 lb 12.8 oz (73.8 kg) (89 %, Z= 1.21)*  08/02/18 156 lb 12.8 oz (71.1 kg) (87 %, Z= 1.13)*   * Growth percentiles are based on CDC (Girls, 2-20 Years) data.   HC Readings from Last 3 Encounters:  No data found for [facility]   Body surface area is 1.74 meters squared. Facility age limit for growth percentiles is 20 years. Facility age limit for growth percentiles is 20 years.    PHYSICAL EXAM:  Constitutional: The patient appears healthy and well nourished.  She has lost 15 pound since since last visit.  Head: The head is normocephalic. No areas of alopecia noted.  Face: The face appears normal. There are no obvious dysmorphic features. Eyes: The eyes appear to be normally formed and spaced. Gaze is conjugate. There is no obvious arcus or proptosis. Moisture appears normal. Ears: The ears are normally placed and appear externally normal. Mouth: The oropharynx and tongue appear normal. Dentition appears to be normal for age. Oral moisture is normal. Neck: The neck appears to be visibly normal. The thyroid gland is 14 grams in size. The consistency of the thyroid gland is normal. The thyroid gland is not tender to palpation. Lungs: No increased work of breathing.  Heart: regular pulses and peripheral perfusion Abdomen: The abdomen appears to be normal in size for the patient's age.  There is no obvious hepatomegaly, splenomegaly, or other mass effect.  Arms: Muscle size and bulk are normal for age. Hands: There is no obvious tremor. Phalangeal and metacarpophalangeal joints are normal. Palmar muscles are normal for age. Palmar skin is normal. Palmar moisture is also normal. Legs: Muscles appear normal for age. No edema is present. Feet: Feet are normally formed. Dorsalis pedal pulses are normal. Neurologic: Strength is normal for age in both the  upper and lower extremities. Muscle tone is normal. Sensation to touch is normal in both the legs and feet.   GYN/GU: normal   LAB DATA:    Results for orders placed or performed in visit on 08/09/19 (from the past 672 hour(s))  TSH   Collection Time: 11/10/19  1:33 PM  Result Value Ref Range   TSH 14.86 (H) mIU/L  T4, free   Collection Time: 11/10/19  1:33 PM  Result Value Ref Range   Free T4 0.9 0.8 - 1.4 ng/dL      Assessment and Plan:  Assessment  ASSESSMENT:  Maisen is a 19 y.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    Thyroid - she has antibodies for both hypo and hyper thyroidism - she has been clinically euthyroid- possibly starting to feel more hypothyroid.  - She is currently taking 25 mcg of Synthroid - She has previously been very sensitive to dose changes - Discussed options for increasing dose - Agreed to increase dose to 50 mcg daily - Repeat labs in about 6 weeks.    PLAN:   1. Diagnostic: TFTs as above Repeat TFTs in 6 weeks. Repeat again for next visit.   2. Therapeutic: Synthroid 50 mcg daily 3. Patient education: Lengthy discussion of the above. Raiya to call if concerns prior to next visit.  4. Follow-up: Return in about 6 weeks (around 01/11/2020).      Dessa Phi, MD >30 minutes spent today reviewing the medical chart, counseling the patient/family, and documenting today's encounter.

## 2020-01-11 ENCOUNTER — Ambulatory Visit (INDEPENDENT_AMBULATORY_CARE_PROVIDER_SITE_OTHER): Payer: Medicaid Other | Admitting: Pediatric Endocrinology

## 2020-01-29 ENCOUNTER — Ambulatory Visit (INDEPENDENT_AMBULATORY_CARE_PROVIDER_SITE_OTHER): Payer: Medicaid Other

## 2020-01-29 ENCOUNTER — Other Ambulatory Visit: Payer: Self-pay

## 2020-01-29 DIAGNOSIS — Z3042 Encounter for surveillance of injectable contraceptive: Secondary | ICD-10-CM

## 2020-01-29 DIAGNOSIS — Z3049 Encounter for surveillance of other contraceptives: Secondary | ICD-10-CM

## 2020-01-29 MED ORDER — MEDROXYPROGESTERONE ACETATE 150 MG/ML IM SUSP: 150.0000 mg | Freq: Once | INTRAMUSCULAR | Status: DC

## 2020-01-29 MED ORDER — MEDROXYPROGESTERONE ACETATE 150 MG/ML IM SUSP
150.0000 mg | Freq: Once | INTRAMUSCULAR | Status: AC
  Administered 2020-01-29: 150 mg via INTRAMUSCULAR

## 2020-01-29 NOTE — Progress Notes (Signed)
Pt presents for depo injection. Pt within depo window, no urine hcg needed. Injection given, tolerated well. F/u depo injection visit scheduled.   

## 2020-02-20 ENCOUNTER — Other Ambulatory Visit: Payer: Self-pay

## 2020-02-20 ENCOUNTER — Encounter (INDEPENDENT_AMBULATORY_CARE_PROVIDER_SITE_OTHER): Payer: Self-pay | Admitting: Pediatric Endocrinology

## 2020-02-20 ENCOUNTER — Ambulatory Visit (INDEPENDENT_AMBULATORY_CARE_PROVIDER_SITE_OTHER): Payer: Medicaid Other | Admitting: Pediatric Endocrinology

## 2020-02-20 VITALS — BP 114/68 | HR 76 | Wt 141.6 lb

## 2020-02-20 DIAGNOSIS — E559 Vitamin D deficiency, unspecified: Secondary | ICD-10-CM

## 2020-02-20 DIAGNOSIS — R634 Abnormal weight loss: Secondary | ICD-10-CM

## 2020-02-20 DIAGNOSIS — E063 Autoimmune thyroiditis: Secondary | ICD-10-CM | POA: Diagnosis not present

## 2020-02-20 LAB — TSH: TSH: 6.02 mIU/L — ABNORMAL HIGH

## 2020-02-20 LAB — T4, FREE: Free T4: 0.9 ng/dL (ref 0.8–1.4)

## 2020-02-20 NOTE — Patient Instructions (Addendum)
5: Acknowledge FIVE things you see around you. It could be a pen, a spot on the ceiling, anything in your surroundings.  4: Acknowledge FOUR things you can touch around you. It could be your hair, a pillow, or the ground under your feet.   3: Acknowledge THREE things you hear. This could be any external sound. If you can hear your belly rumbling that counts! Focus on things you can hear outside of your body.  2: Acknowledge TWO things you can smell. Maybe you are in your office and smell pencil, or maybe you are in your bedroom and smell a pillow. If you need to take a brief walk to find a scent you could smell soap in your bathroom, or nature outside.  1: Acknowledge ONE thing you can taste. What does the inside of your mouth taste like--gum, coffee, or the sandwich from lunch?  Look at tapping techniques for anxiety.   Will continue Synthroid at 50 mcg daily.   Talk to Melanie Crazier about referral for therapy.   Referral placed for nutrition.   Look at [employer]. The key is LIMITING not EXCLUDING.   2000 IU of Vit D per day 1000 mg of Calcium per day  Work on eating at least 3 times a day. Aim for 2 supplement shakes (like Ensure) per day.  Breakfast-shake-lunch-shake-dinner.

## 2020-02-20 NOTE — Progress Notes (Signed)
Subjective:  Subjective  Patient Name: Kristina Shaw Date of Birth: 10-Sep-2001  MRN: [id number]  Kristina Shaw  Presents to clinic today for follow-up evaluation and management of her abnormal thyroid function tests, hypovitamin d and obesity   HISTORY OF PRESENT ILLNESS:   Kristina Shaw is a 19 y.o. AA female   Kristina Shaw was accompanied by her mom  1. Kristina Shaw was seen by her pcp in august 2014 for her [facility]. At that visit they obtained annual labs which were concerning for TSH of 9.6 with borderline free T4 of 0.81 and a vit D level of 14ng/dL. A1C, cholesterol, and CMP were all normal. She was referred to endocrinology for further evaluation and management of her abnormal blood work. She started Synthroid in September 2014 when TSH was 12.7.  She has a history of very high thyroid antibodies.   2. The patient's last [facility] visit was on 11/30/19. In the interim, she has been ok.   At her last visit we increase her Synthroid from 25 mcg to 50 mcg. She feels that her anxiety and jitteriness have worsened. Mom is concerned that she has lost weight.   Mom says that she is still sleeping all day. Her semester is about to end for her school.   She feels that she is eating less. Mom says that she is only eating once a day. She says that she only is hungry once a day.   She feels frustrated that none of her clothes fit as she has lost weight.   Mom thinks that the main issue is depression/anxiety.  She does not want Kristina Shaw to start on medication yet. Mom also has been having some anxiety with the pandemic. She feels that there are natural ways for Kristina Shaw to work on her depression and anxiety.   She will be starting work soon at [employer]. 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   She [facility]   She feels that her temperature tolerance is more normal.   She feels that her stools are regular. She denies  constipation or diarrhea.   She has not any trouble sleeping with the increase in dose. However, she continues to feel low energy.   She is nervous about going up quickly on her Synthroid dose. She would like to go up in very small steps.   3. Pertinent Review of Systems:  Constitutional: The patient feels "fine". The patient seems healthy and active. She just woke up to come to clinic this afternoon.  Eyes: having issues with distance vision. Glasses for distance.  Neck: The patient has no complaints of anterior neck swelling, soreness, tenderness, pressure, discomfort, or difficulty swallowing.   Heart: Heart rate increases with exercise or other physical activity. The patient has no complaints of palpitations, irregular heart beats, chest pain, or chest pressure.   Lungs: no asthma or wheezing Gastrointestinal: Bowel movents seem normal. The patient has no complaints of excessive hunger, acid reflux, upset stomach, stomach aches or pains, diarrhea, or constipation.  Legs: Muscle mass and strength seem normal. There are no complaints of numbness, tingling, burning, or pain. No edema is noted.  Feet: There are no obvious foot problems. There are no complaints of numbness, tingling, burning, or pain. No edema is noted. Neurologic: There are no recognized problems with muscle movement and strength, sensation, or coordination. GYN/GU: Now on depot provera- no menses.  Skin: no issues  PAST  MEDICAL, FAMILY, AND SOCIAL HISTORY  Past Medical History:  Diagnosis Date  . H/O otitis media   . Seasonal allergies     Family History  Problem Relation Age of Onset  . Obesity Mother   . Hyperlipidemia Mother   . Hypertension Maternal Grandmother   . Diabetes Maternal Grandmother   . Allergic rhinitis Father   . Eczema Sister   . Eczema Brother   . Thyroid disease Neg Hx      Current Outpatient Medications:  .  levothyroxine (SYNTHROID) 50 MCG tablet, Take 1 tablet (50 mcg total) by mouth  daily., Disp: 30 tablet, Rfl: 5 .  medroxyPROGESTERone Acetate (DEPO-PROVERA IM), Inject into the muscle., Disp: , Rfl:  .  cetirizine (ZYRTEC) 10 MG tablet, Take 1 tablet (10 mg total) by mouth daily. (Patient not taking: Reported on 02/20/2020), Disp: 30 tablet, Rfl: 5 .  diphenhydrAMINE (BENADRYL) 25 mg capsule, Take 25 mg by mouth every 6 (six) hours as needed., Disp: , Rfl:  .  hydrocortisone 2.5 % ointment, APPLY TO AFFECTED AREA TWICE A DAY X14 DAYS, Disp: , Rfl:  .  KARIVA 0.15-0.02/0.01 MG (21/5) tablet, TAKE 1 TABLET BY MOUTH EVERY DAY (Patient not taking: Reported on 11/30/2019), Disp: 28 tablet, Rfl: 6 .  mometasone (NASONEX) 50 MCG/ACT nasal spray, Place 2 sprays into the nose daily. Two sprays each in each nostril (Patient not taking: Reported on 12/01/2016), Disp: 17 g, Rfl: 5 .  Olopatadine HCl (PAZEO) 0.7 % SOLN, Place 1 drop into both eyes 1 day or 1 dose. (Patient not taking: Reported on 12/01/2016), Disp: 1 Bottle, Rfl: 5 .  triamcinolone cream (KENALOG) 0.1 %, Apply twice a day to affected areas, Disp: , Rfl:  .  Vitamin D, Ergocalciferol, (DRISDOL) 1.25 MG (50000 UT) CAPS capsule, Take 1 capsule (50,000 Units total) by mouth every 7 (seven) days. (Patient not taking: Reported on 04/03/2019), Disp: 12 capsule, Rfl: 0  Allergies as of 02/20/2020  . (No Known Allergies)     reports that she is a non-smoker but has been exposed to tobacco smoke. She has never used smokeless tobacco. She reports that she does not drink alcohol or use drugs. Pediatric History  Patient Parents/Guardians  . Kristina Shaw (Mother/Guardian)   Other Topics Concern  . Not on file  Social History Narrative   Lives with mom, dad, 1 sister, 1 brother   Dance and theater   [place]. 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    Primary Care Provider: Kirkland Hun, MD  ROS: There are no other significant problems involving Kristina Shaw's other body systems.    Objective:  Objective  Vital Signs:   BP  114/68   Pulse 76   Wt 141 lb 9.6 oz (64.2 kg)   BMI 24.17 kg/m    Ht Readings from Last 3 Encounters:  11/30/19 5' 4.17" (1.63 m)  08/09/19 5' 3.78" (1.62 m) (42 %, Z= -0.20)*  08/02/18 5' 3.98" (1.625 m) (46 %, Z= -0.11)*   * Growth percentiles are based on CDC (Girls, 2-20 Years) data.   Wt Readings from Last 3 Encounters:  02/20/20 141 lb 9.6 oz (64.2 kg)  11/30/19 147 lb 9.6 oz (67 kg)  08/09/19 162 lb 12.8 oz (73.8 kg) (89 %, Z= 1.21)*   * Growth percentiles are based on CDC (Girls, 2-20 Years) data.   HC Readings from Last 3 Encounters:  No data found for [facility]   Body surface area is 1.7 meters squared. Facility age limit for  growth percentiles is 20 years. Facility age limit for growth percentiles is 20 years.    PHYSICAL EXAM:  Constitutional: The patient appears healthy and well nourished. She has lost another 6 pound since since last visit. Total weight loss 21 pounds since October.  Head: The head is normocephalic. No areas of alopecia noted.  Face: The face appears normal. There are no obvious dysmorphic features. Eyes: The eyes appear to be normally formed and spaced. Gaze is conjugate. There is no obvious arcus or proptosis. Moisture appears normal. Ears: The ears are normally placed and appear externally normal. Mouth: The oropharynx and tongue appear normal. Dentition appears to be normal for age. Oral moisture is normal. Neck: The neck appears to be visibly normal. The thyroid gland is 14 grams in size. The consistency of the thyroid gland is normal. The thyroid gland is not tender to palpation. Lungs: No increased work of breathing.  Heart: regular pulses and peripheral perfusion Abdomen: The abdomen appears to be normal in size for the patient's age.  There is no obvious hepatomegaly, splenomegaly, or other mass effect.  Arms: Muscle size and bulk are normal for age. Hands: There is no obvious tremor. Phalangeal and metacarpophalangeal joints are normal.  Palmar muscles are normal for age. Palmar skin is normal. Palmar moisture is also normal. Legs: Muscles appear normal for age. No edema is present. Feet: Feet are normally formed. Dorsalis pedal pulses are normal. Neurologic: Strength is normal for age in both the upper and lower extremities. Muscle tone is normal. Sensation to touch is normal in both the legs and feet.   GYN/GU: normal   LAB DATA:      Results for orders placed or performed in visit on 11/30/19 (from the past 672 hour(s))  TSH   Collection Time: 02/19/20  3:38 PM  Result Value Ref Range   TSH 6.02 (H) mIU/L  T4, free   Collection Time: 02/19/20  3:38 PM  Result Value Ref Range   Free T4 0.9 0.8 - 1.4 ng/dL      Assessment and Plan:  Assessment  ASSESSMENT:  Kristina Shaw is a 19 y.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    Thyroid - she has antibodies for both hypo and hyper thyroidism - she has been chemically more hypothyroid.  - She is currently taking 50 mcg of Synthroid - She has previously been very sensitive to dose changes - Family is concerned about weight loss and increased anxiety.  - Discussed options for increasing dose - Agreed to continue dose of 50 mcg daily - Repeat labs in about 8 weeks. (orders in)   PLAN:     1. Diagnostic: TFTs as above Repeat TFTs in 8weeks.    2. Therapeutic: Synthroid 50 mcg daily 3. Patient education: Lengthy discussion of the above. Kristina Shaw to call if concerns prior to next visit.  4. Follow-up: Return in about 2 months (around 04/21/2020).      Dessa Phi, MD  Level of service: Level of Service: This visit lasted in excess of 40 minutes. More than 50% of the visit was devoted to counseling.

## 2020-04-15 ENCOUNTER — Other Ambulatory Visit: Payer: Self-pay

## 2020-04-15 ENCOUNTER — Ambulatory Visit (INDEPENDENT_AMBULATORY_CARE_PROVIDER_SITE_OTHER): Payer: Medicaid Other

## 2020-04-15 DIAGNOSIS — Z3042 Encounter for surveillance of injectable contraceptive: Secondary | ICD-10-CM | POA: Diagnosis not present

## 2020-04-15 MED ORDER — MEDROXYPROGESTERONE ACETATE 150 MG/ML IM SUSP
150.0000 mg | Freq: Once | INTRAMUSCULAR | Status: AC
  Administered 2020-04-15: 150 mg via INTRAMUSCULAR

## 2020-04-15 NOTE — Progress Notes (Signed)
Pt presents for depo injection. Pt within depo window, no urine hcg needed. Injection given, tolerated well. F/u depo injection visit scheduled.   

## 2020-04-22 ENCOUNTER — Encounter (INDEPENDENT_AMBULATORY_CARE_PROVIDER_SITE_OTHER): Payer: Self-pay | Admitting: Pediatric Endocrinology

## 2020-04-22 ENCOUNTER — Other Ambulatory Visit: Payer: Self-pay

## 2020-04-22 ENCOUNTER — Ambulatory Visit (INDEPENDENT_AMBULATORY_CARE_PROVIDER_SITE_OTHER): Payer: Medicaid Other | Admitting: Pediatric Endocrinology

## 2020-04-22 DIAGNOSIS — E063 Autoimmune thyroiditis: Secondary | ICD-10-CM

## 2020-04-22 MED ORDER — LEVOTHYROXINE SODIUM 75 MCG PO TABS: 75.0000 ug | ORAL_TABLET | Freq: Every day | ORAL | 3 refills | Status: DC

## 2020-04-22 NOTE — Patient Instructions (Addendum)
Start Vit D3 1000 iu per day.   Increase Synthroid to 75 mcg daily.   If you start to feel hyperthyroid- please let me know and go have labs drawn. Labs are in for [facility]. Please do have them drawn before your next visit.

## 2020-04-22 NOTE — Progress Notes (Signed)
Subjective:  Subjective  Patient Name: Kristina Shaw Date of Birth: [birth year]  MRN: [id number]  Kristina Shaw  Presents to clinic today for follow-up evaluation and management of her abnormal thyroid function tests, hypovitamin d and obesity   HISTORY OF PRESENT ILLNESS:   Kristina Shaw is a 19 y.o. AA female   Tanija was accompanied by her mom  1. Esmae was seen by her pcp in august 2014 for her [facility]. At that visit they obtained annual labs which were concerning for TSH of 9.6 with borderline free T4 of 0.81 and a vit D level of 14ng/dL. A1C, cholesterol, and CMP were all normal. She was referred to endocrinology for further evaluation and management of her abnormal blood work. She started Synthroid in September 2014 when TSH was 12.7.  She has a history of very high thyroid antibodies.   2. The patient's last [facility] visit was on 02/20/20. In the interim, she has been ok.   She has continued on 50 mcg of Synthroid daily. She has been taking it every day. She is no longer having anxiety or jitteriness on her Synthroid.   She is still sleeping a lot when she is not at work. 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 She thinks that her current weight loss is due to walking all day there 6 days a week.   She feels that she is eating more.   She has finished her 3rd year at [employer]. She will be a senior this fall.   She has gotten her Covid vaccines and her anxiety is a lot better.   She is still getting Depo Pro[facility]a [facility].   She feels that her temperature tolerance is more normal.   She feels that her stools are regular. She denies constipation or diarrhea.   She has not any trouble sleeping with the increase in dose. However, she continues to feel low energy. Still sleeping when not at work.    3. Pertinent Review of Systems:  Constitutional: The patient feels "good". The patient seems healthy and active.  Eyes: having issues with  distance vision. Glasses for distance.  Neck: The patient has no complaints of anterior neck swelling, soreness, tenderness, pressure, discomfort, or difficulty swallowing.   Heart: Heart rate increases with exercise or other physical activity. The patient has no complaints of palpitations, irregular heart beats, chest pain, or chest pressure.   Lungs: no asthma or wheezing Gastrointestinal: Bowel movents seem normal. The patient has no complaints of excessive hunger, acid reflux, upset stomach, stomach aches or pains, diarrhea, or constipation.  Legs: Muscle mass and strength seem normal. There are no complaints of numbness, tingling, burning, or pain. No edema is noted.  Feet: There are no obvious foot problems. There are no complaints of numbness, tingling, burning, or pain. No edema is noted. Neurologic: There are no recognized problems with muscle movement and strength, sensation, or coordination. GYN/GU: Now on depot provera- no menses.  Skin: no issues  PAST MEDICAL, FAMILY, AND SOCIAL HISTORY  Past Medical History:  Diagnosis Date  . H/O otitis media   . Seasonal allergies     Family History  Problem Relation Age of Onset  . Obesity Mother   . Hyperlipidemia Mother   . Hypertension Maternal Grandmother   . Diabetes Maternal Grandmother   . Allergic rhinitis Father   . Eczema Sister   . Eczema Brother   . Thyroid disease Neg Hx  Current Outpatient Medications:  .  cetirizine (ZYRTEC) 10 MG tablet, Take 1 tablet (10 mg total) by mouth daily., Disp: 30 tablet, Rfl: 5 .  medroxyPROGESTERone Acetate (DEPO-PROVERA IM), Inject into the muscle., Disp: , Rfl:  .  levothyroxine (SYNTHROID) 75 MCG tablet, Take 1 tablet (75 mcg total) by mouth daily., Disp: 90 tablet, Rfl: 3  Allergies as of 04/22/2020  . (No Known Allergies)     reports that she is a non-smoker but has been exposed to tobacco smoke. She has never used smokeless tobacco. She reports that she does not drink  alcohol and does not use drugs. Pediatric History  Patient Parents/Guardians  . Blasing,Cassandra (Mother/Guardian)   Other Topics Concern  . 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    [employer]. Lobbyist. Senior.    Primary Care Provider: Samantha Crimes, MD  ROS: There are no other significant problems involving Kristina Shaw other body systems.    Objective:  Objective  Vital Signs:   BP 126/74   Wt 139 lb 6.4 oz (63.2 kg)   BMI 23.80 kg/m     Ht Readings from Last 3 Encounters:  11/30/19 5' 4.17" (1.63 m)  08/09/19 5' 3.78" (1.62 m) (42 %, Z= -0.20)*  08/02/18 5' 3.98" (1.625 m) (46 %, Z= -0.11)*   * Growth percentiles are based on CDC (Girls, 2-20 Years) data.   Wt Readings from Last 3 Encounters:  04/22/20 139 lb 6.4 oz (63.2 kg)  02/20/20 141 lb 9.6 oz (64.2 kg)  11/30/19 147 lb 9.6 oz (67 kg)   HC Readings from Last 3 Encounters:  No data found for [facility]   Body surface area is 1.69 meters squared. Facility age limit for growth percentiles is 20 years. Facility age limit for growth percentiles is 20 years.   PHYSICAL EXAM:   Constitutional: The patient appears healthy and well nourished. She has lost another 2 pound since since last visit. Total weight loss 23 pounds since October.  Head: The head is normocephalic. No areas of alopecia noted.  Face: The face appears normal. There are no obvious dysmorphic features. Eyes: The eyes appear to be normally formed and spaced. Gaze is conjugate. There is no obvious arcus or proptosis. Moisture appears normal. Ears: The ears are normally placed and appear externally normal. Mouth: The oropharynx and tongue appear normal. Dentition appears to be normal for age. Oral moisture is normal. Neck: The neck appears to be visibly normal. The thyroid gland is 14 grams in size. The consistency of the thyroid gland is normal. The  thyroid gland is not tender to palpation. Lungs: No increased work of breathing.  Heart: regular pulses and peripheral perfusion Abdomen: The abdomen appears to be normal in size for the patient's age.  There is no obvious hepatomegaly, splenomegaly, or other mass effect.  Arms: Muscle size and bulk are normal for age. Hands: There is no obvious tremor. Phalangeal and metacarpophalangeal joints are normal. Palmar muscles are normal for age. Palmar skin is normal. Palmar moisture is also normal. Legs: Muscles appear normal for age. No edema is present. Feet: Feet are normally formed. Dorsalis pedal pulses are normal. Neurologic: Strength is normal for age in both the upper and lower extremities. Muscle tone is normal. Sensation to touch is normal in both the legs and feet.   GYN/GU: normal   LAB DATA:  Results for orders placed or performed in visit on 02/20/20 (from the past 672 hour(s))  TSH   Collection Time: 04/19/20  1:27 PM  Result Value Ref Range   TSH 9.07 (H) mIU/L  T4, free   Collection Time: 04/19/20  1:27 PM  Result Value Ref Range   Free T4 0.9 0.8 - 1.4 ng/dL  Comprehensive metabolic panel   Collection Time: 04/19/20  1:27 PM  Result Value Ref Range   Glucose, Bld 76 65 - 99 mg/dL   BUN 5 (L) 7 - 25 mg/dL   Creat 0.64 0.50 - 1.10 mg/dL   BUN/Creatinine Ratio 8 6 - 22 (calc)   Sodium 139 135 - 146 mmol/L   Potassium 4.6 3.5 - 5.3 mmol/L   Chloride 105 98 - 110 mmol/L   CO2 25 20 - 32 mmol/L   Calcium 9.9 8.6 - 10.2 mg/dL   Total Protein 6.9 6.1 - 8.1 g/dL   Albumin 4.4 3.6 - 5.1 g/dL   Globulin 2.5 1.9 - 3.7 g/dL (calc)   AG Ratio 1.8 1.0 - 2.5 (calc)   Total Bilirubin 1.3 (H) 0.2 - 1.2 mg/dL   Alkaline phosphatase (APISO) 53 31 - 125 U/L   AST 13 10 - 30 U/L   ALT 4 (L) 6 - 29 U/L  VITAMIN D 25 Hydroxy (Vit-D Deficiency, Fractures)   Collection Time: 04/19/20  1:27 PM  Result Value Ref Range   Vit D, 25-Hydroxy 24 (L) 30 - 100 ng/mL  Thyroid  peroxidase antibody   Collection Time: 04/19/20  1:27 PM  Result Value Ref Range   Thyroperoxidase Ab SerPl-aCnc >900 (H) <9 IU/mL  Thyroglobulin antibody   Collection Time: 04/19/20  1:27 PM  Result Value Ref Range   Thyroglobulin Ab 21 (H) < or = 1 IU/mL  CBC with Differential/Platelet   Collection Time: 04/19/20  1:27 PM  Result Value Ref Range   WBC 5.7 3.8 - 10.8 Thousand/uL   RBC 4.57 3.80 - 5.10 Million/uL   Hemoglobin 12.7 11.7 - 15.5 g/dL   HCT 39.5 35 - 45 %   MCV 86.4 80.0 - 100.0 fL   MCH 27.8 27.0 - 33.0 pg   MCHC 32.2 32.0 - 36.0 g/dL   RDW 13.4 11.0 - 15.0 %   Platelets 206 140 - 400 Thousand/uL   MPV 11.2 7.5 - 12.5 fL   Neutro Abs 1,687 1,500 - 7,800 cells/uL   Lymphs Abs 3,295 850 - 3,900 cells/uL   Absolute Monocytes 479 200 - 950 cells/uL   Eosinophils Absolute 188 15 - 500 cells/uL   Basophils Absolute 51 0 - 200 cells/uL   Neutrophils Relative % 29.6 %   Total Lymphocyte 57.8 %   Monocytes Relative 8.4 %   Eosinophils Relative 3.3 %   Basophils Relative 0.9 %      Assessment and Plan:  Assessment  ASSESSMENT:  Kristina Shaw is a 19 y.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    Thyroid - she has antibodies for both hypo and hyper thyroidism - she has been chemically more hypothyroid.  - She is currently taking 50 mcg of Synthroid - She has previously been very sensitive to dose changes - She has continued to lose weight - Discussed options for increasing dose - Agreed to increase dose to 75 mcg - Repeat labs in about 8 weeks. (orders in for [facility])   PLAN:   1. Diagnostic: TFTs as above Repeat TFTs in 8weeks.    2. Therapeutic:  Synthroid 50 mcg daily -> Synthroid 75 mcg daily 3. Patient education: Lengthy discussion of the above. Kristina Shaw to call if concerns prior to next visit.  4. Follow-up: Return in about 2 months (around 06/22/2020). Can do virtual appointment     Dessa Phi, MD  Level of service: >30 minutes spent  today reviewing the medical chart, counseling the patient/family, and documenting today's encounter.

## 2020-04-24 LAB — T4, FREE: Free T4: 0.9 ng/dL (ref 0.8–1.4)

## 2020-04-24 LAB — TSH: TSH: 9.07 mIU/L — ABNORMAL HIGH

## 2020-04-24 LAB — CBC WITH DIFFERENTIAL/PLATELET
Absolute Monocytes: 479 cells/uL (ref 200–950)
Basophils Absolute: 51 cells/uL (ref 0–200)
Basophils Relative: 0.9 %
Eosinophils Absolute: 188 cells/uL (ref 15–500)
Eosinophils Relative: 3.3 %
HCT: 39.5 % (ref 35.0–45.0)
Hemoglobin: 12.7 g/dL (ref 11.7–15.5)
Lymphs Abs: 3295 cells/uL (ref 850–3900)
MCH: 27.8 pg (ref 27.0–33.0)
MCHC: 32.2 g/dL (ref 32.0–36.0)
MCV: 86.4 fL (ref 80.0–100.0)
MPV: 11.2 fL (ref 7.5–12.5)
Monocytes Relative: 8.4 %
Neutro Abs: 1687 cells/uL (ref 1500–7800)
Neutrophils Relative %: 29.6 %
Platelets: 206 10*3/uL (ref 140–400)
RBC: 4.57 10*6/uL (ref 3.80–5.10)
RDW: 13.4 % (ref 11.0–15.0)
Total Lymphocyte: 57.8 %
WBC: 5.7 10*3/uL (ref 3.8–10.8)

## 2020-04-24 LAB — THYROID PEROXIDASE ANTIBODY: Thyroperoxidase Ab SerPl-aCnc: >900 IU/mL — ABNORMAL HIGH (ref <9)

## 2020-04-24 LAB — COMPREHENSIVE METABOLIC PANEL
AG Ratio: 1.8 (calc) (ref 1.0–2.5)
ALT: 4 U/L — ABNORMAL LOW (ref 6–29)
AST: 13 U/L (ref 10–30)
Albumin: 4.4 g/dL (ref 3.6–5.1)
Alkaline phosphatase (APISO): 53 U/L (ref 31–125)
BUN/Creatinine Ratio: 8 (calc) (ref 6–22)
BUN: 5 mg/dL — ABNORMAL LOW (ref 7–25)
CO2: 25 mmol/L (ref 20–32)
Calcium: 9.9 mg/dL (ref 8.6–10.2)
Chloride: 105 mmol/L (ref 98–110)
Creat: 0.64 mg/dL (ref 0.50–1.10)
Globulin: 2.5 g/dL (calc) (ref 1.9–3.7)
Glucose, Bld: 76 mg/dL (ref 65–99)
Potassium: 4.6 mmol/L (ref 3.5–5.3)
Sodium: 139 mmol/L (ref 135–146)
Total Bilirubin: 1.3 mg/dL — ABNORMAL HIGH (ref 0.2–1.2)
Total Protein: 6.9 g/dL (ref 6.1–8.1)

## 2020-04-24 LAB — THYROGLOBULIN ANTIBODY: Thyroglobulin Ab: 21 IU/mL — ABNORMAL HIGH (ref <=1)

## 2020-04-24 LAB — THYROID STIMULATING IMMUNOGLOBULIN: TSI: 107 % baseline (ref <140)

## 2020-04-24 LAB — VITAMIN D 25 HYDROXY (VIT D DEFICIENCY, FRACTURES): Vit D, 25-Hydroxy: 24 ng/mL — ABNORMAL LOW (ref 30–100)

## 2020-06-24 ENCOUNTER — Telehealth (INDEPENDENT_AMBULATORY_CARE_PROVIDER_SITE_OTHER): Payer: Medicaid Other | Admitting: Pediatric Endocrinology

## 2020-06-24 DIAGNOSIS — E063 Autoimmune thyroiditis: Secondary | ICD-10-CM | POA: Diagnosis not present

## 2020-06-24 NOTE — Progress Notes (Signed)
This is a Pediatric Specialist E-Visit follow up consult provided via [facility]  [name] consented to an E-Visit consult today.  Location of patient: Kristina Shaw is at [facility] Location of provider: Koren Shiver is at [facility]ist Patient was referred by Samantha Crimes, MD   The following participants were involved in this E-Visit: Mertie Moores, RMA Dessa Phi, MD Olive Bass- patient.  Chief Complain/ Reason for E-Visit today: Hypothyroidism Total time on call: 12 minutes Follow up: 4 months     Subjective:  Subjective  Patient Name: Kristina Shaw Date of Birth: 2001-01-07  MRN: [id number]  Kristina Shaw  Presents Via Care[facility] today for follow-up evaluation and management of her abnormal thyroid function tests, hypovitamin d and obesity   HISTORY OF PRESENT ILLNESS:   Kristina Shaw is a 19 y.o. AA female   Kristina Shaw was unaccompanied  1. [name] was seen by her pcp in august 2014 for her [facility]. At that visit they obtained annual labs which were concerning for TSH of 9.6 with borderline free T4 of 0.81 and a vit D level of 14ng/dL. A1C, cholesterol, and CMP were all normal. She was referred to endocrinology for further evaluation and management of her abnormal blood work. She started Synthroid in September 2014 when TSH was 12.7.  She has a history of very high thyroid antibodies.   2. The patient's last [facility] visit was on 04/22/20. In the interim, she has been ok.   She is back on campus for her senior year of college.   After her last visit we increased her Synthroid to 75 mcg. She is feeling fine on this dose.   She feels that she has some anxiety- but she thinks it is related to school starting.  She is not having any jitteriness.   She feels that her energy level is a lot better.    She does not have a recent weight. Her last weight was early July and was 135#. This was less than at her visit here in June.   She says that at school  she is eating more and walking less.   She is still getting Depo Provera from [facility].   She feels that her temperature tolerance is still normal.   She feels that her stools are still regular. She denies constipation or diarrhea.   She denies issues with sleeping- other than when her sleep schedule is off. She has morning classes at 9am.   3. Pertinent Review of Systems:  Constitutional: The patient feels "good". The patient seems healthy and active.  Eyes: having issues with distance vision. Glasses for distance.  Neck: The patient has no complaints of anterior neck swelling, soreness, tenderness, pressure, discomfort, or difficulty swallowing.   Heart: Heart rate increases with exercise or other physical activity. The patient has no complaints of palpitations, irregular heart beats, chest pain, or chest pressure.   Lungs: no asthma or wheezing Gastrointestinal: Bowel movents seem normal. The patient has no complaints of excessive hunger, acid reflux, upset stomach, stomach aches or pains, diarrhea, or constipation.  Legs: Muscle mass and strength seem normal. There are no complaints of numbness, tingling, burning, or pain. No edema is noted.  Feet: There are no obvious foot problems. There are no complaints of numbness, tingling, burning, or pain. No edema is noted. Neurologic: There are no recognized problems with muscle movement and strength, sensation, or coordination. GYN/GU: Now on depot provera- no menses.  Skin: no issues  Covid: She  is vaccinated.   PAST MEDICAL, FAMILY, AND SOCIAL HISTORY  Past Medical History:  Diagnosis Date  . H/O otitis media   . Seasonal allergies   . Thyroid disease    Phreesia 06/24/2020    Family History  Problem Relation Age of Onset  . Obesity Mother   . Hyperlipidemia Mother   . Hypertension Maternal Grandmother   . Diabetes Maternal Grandmother   . Allergic rhinitis Father   . Eczema Sister   . Eczema Brother    . Thyroid disease Neg Hx      Current Outpatient Medications:  .  cetirizine (ZYRTEC) 10 MG tablet, Take 1 tablet (10 mg total) by mouth daily., Disp: 30 tablet, Rfl: 5 .  levothyroxine (SYNTHROID) 75 MCG tablet, Take 1 tablet (75 mcg total) by mouth daily., Disp: 90 tablet, Rfl: 3 .  medroxyPROGESTERone Acetate (DEPO-PROVERA IM), Inject into the muscle., Disp: , Rfl:   Allergies as of 06/24/2020  . (No Known Allergies)     reports that she is a non-smoker but has been exposed to tobacco smoke. She has never used smokeless tobacco. She reports that she does not drink alcohol and does not use drugs. Pediatric History  Patient Parents/Guardians  . Lope,Cassandra (Mother/Guardian)   Other Topics Concern  . 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    [employer]. Lobbyist. Senior.     Primary Care Provider: Samantha Crimes, MD  ROS: There are no other significant problems involving Kristina Shaw's other body systems.    Objective:  Objective  Vital Signs:  Virtual Visit      Ht Readings from Last 3 Encounters:  11/30/19 5' 4.17" (1.63 m)  08/09/19 5' 3.78" (1.62 m) (42 %, Z= -0.20)*  08/02/18 5' 3.98" (1.625 m) (46 %, Z= -0.11)*   * Growth percentiles are based on CDC (Girls, 2-20 Years) data.   Wt Readings from Last 3 Encounters:  04/22/20 139 lb 6.4 oz (63.2 kg)  02/20/20 141 lb 9.6 oz (64.2 kg)  11/30/19 147 lb 9.6 oz (67 kg)   HC Readings from Last 3 Encounters:  No data found for HC   There is no height or weight on file to calculate BSA. Facility age limit for growth percentiles is 20 years. Facility age limit for growth percentiles is 20 years.   PHYSICAL EXAM:  Virtual:  Patient appears healthy and in no distress She has normal oral moisture No visible goiter Normal work of breathing Normal appearance of extremities No movement issues.   LAB DATA:     pending   No results found for this or any previous visit (from the past 672 hour(s)).    Assessment and Plan:  Assessment  ASSESSMENT:  Hania is a 19 y.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    Thyroid - she has antibodies for both hypo and hyper thyroidism - she has been clinically and chemically more hypothyroid.  - She is currently taking 75 mcg of Synthroid - She has previously been very sensitive to dose changes - She did continue to lose weight this summer after we changed her dose. She is unsure what her current weight is.  - Repeat labs today/tomorrow. (orders in for [facility])   PLAN:   1. Diagnostic: TFTs today ([facility]) 2. Therapeutic: Synthroid 75 mcg daily 3. Patient education: Discussions as above.  4. Follow-up: Return in about 4 months (  around 10/24/2020).     Dessa Phi, MD  Level of service: Level 3

## 2020-06-24 NOTE — Patient Instructions (Signed)
Labs today or tomorrow at [facility]

## 2020-07-04 ENCOUNTER — Ambulatory Visit: Payer: Medicaid Other

## 2020-07-09 ENCOUNTER — Ambulatory Visit (INDEPENDENT_AMBULATORY_CARE_PROVIDER_SITE_OTHER): Payer: Medicaid Other

## 2020-07-09 ENCOUNTER — Other Ambulatory Visit: Payer: Self-pay

## 2020-07-09 DIAGNOSIS — Z3049 Encounter for surveillance of other contraceptives: Secondary | ICD-10-CM

## 2020-07-09 DIAGNOSIS — Z3042 Encounter for surveillance of injectable contraceptive: Secondary | ICD-10-CM

## 2020-07-09 MED ORDER — MEDROXYPROGESTERONE ACETATE 150 MG/ML IM SUSP
150.0000 mg | Freq: Once | INTRAMUSCULAR | Status: AC
  Administered 2020-07-09: 150 mg via INTRAMUSCULAR

## 2020-07-09 NOTE — Progress Notes (Signed)
Pt presents for depo injection. Pt within depo window, no urine hcg needed. Injection given, tolerated well. F/u depo injection visit scheduled.   

## 2020-08-01 ENCOUNTER — Other Ambulatory Visit: Payer: Self-pay

## 2020-08-01 ENCOUNTER — Encounter: Payer: Self-pay | Admitting: Podiatry

## 2020-08-01 ENCOUNTER — Ambulatory Visit: Payer: Medicaid Other | Admitting: Podiatry

## 2020-08-01 DIAGNOSIS — L600 Ingrowing nail: Secondary | ICD-10-CM

## 2020-08-02 ENCOUNTER — Encounter: Payer: Self-pay | Admitting: Podiatry

## 2020-08-02 NOTE — Progress Notes (Signed)
  Subjective:  Patient ID: Kristina Shaw, female    DOB: 2001-02-17,  MRN: [id number]  Chief Complaint  Patient presents with  . Nail Problem    Patient presents today for ingrown toenails bilat hallux medial borders x 1 month    19 y.o. female presents with the above complaint.  Patient presents with complaint of bilateral medial border ingrown.  Patient says been going from 1 month there has been some oozing but overall very painful to touch.  She states that she has tried Epson salt soaks as well as antibiotic cream that the primary care physician gave.  She states is tender to touch there are some drainage she denies any other acute complaints she would like to have it removed.  She has not seen anyone else prior to seeing me for this   Review of Systems: Negative except as noted in the HPI. Denies N/V/F/Ch.  Past Medical History:  Diagnosis Date  . H/O otitis media   . Seasonal allergies   . Thyroid disease    Phreesia 06/24/2020    Current Outpatient Medications:  .  cefdinir (OMNICEF) 300 MG capsule, TAKE 1 CAPSULE BY MOUTH EVERY 12 HOURS FOR 10 DAYS, Disp: , Rfl:  .  cetirizine (ZYRTEC) 10 MG tablet, Take 1 tablet (10 mg total) by mouth daily., Disp: 30 tablet, Rfl: 5 .  levothyroxine (SYNTHROID) 75 MCG tablet, Take 1 tablet (75 mcg total) by mouth daily., Disp: 90 tablet, Rfl: 3 .  medroxyPROGESTERone Acetate (DEPO-PROVERA IM), Inject into the muscle., Disp: , Rfl:   Social History   Tobacco Use  Smoking Status Passive Smoke Exposure - Never Smoker  Smokeless Tobacco Never Used    No Known Allergies Objective:  There were no vitals filed for this visit. There is no height or weight on file to calculate BMI. Constitutional Well developed. Well nourished.  Vascular Dorsalis pedis pulses palpable bilaterally. Posterior tibial pulses palpable bilaterally. Capillary refill normal to all digits.  No cyanosis or clubbing noted. Pedal hair growth normal.  Neurologic  Normal speech. Oriented to person, place, and time. Epicritic sensation to light touch grossly present bilaterally.  Dermatologic Painful ingrowing nail at medial nail borders of the hallux nail bilaterally. No other open wounds. No skin lesions.  Orthopedic: Normal joint ROM without pain or crepitus bilaterally. No visible deformities. No bony tenderness.   Radiographs: None Assessment:   1. Ingrown toenail of right foot   2. Ingrown left big toenail    Plan:  Patient was evaluated and treated and all questions answered.  Ingrown Nail, bilaterally -Patient elects to proceed with minor surgery to remove ingrown toenail removal today. Consent reviewed and signed by patient. -Ingrown nail excised. See procedure note. -Educated on post-procedure care including soaking. Written instructions provided and reviewed. -Patient to follow up in 2 weeks for nail check.  Procedure: Excision of Ingrown Toenail Location: Bilateral 1st toe medial nail borders. Anesthesia: Lidocaine 1% plain; 1.5 mL and Marcaine 0.5% plain; 1.5 mL, digital block. Skin Prep: Betadine. Dressing: Silvadene; telfa; dry, sterile, compression dressing. Technique: Following skin prep, the toe was exsanguinated and a tourniquet was secured at the base of the toe. The affected nail border was freed, split with a nail splitter, and excised. Chemical matrixectomy was then performed with phenol and irrigated out with alcohol. The tourniquet was then removed and sterile dressing applied. Disposition: Patient tolerated procedure well. Patient to return in 2 weeks for follow-up.   No follow-ups on file.

## 2020-09-02 ENCOUNTER — Ambulatory Visit (INDEPENDENT_AMBULATORY_CARE_PROVIDER_SITE_OTHER): Payer: Medicaid Other | Admitting: Family Medicine

## 2020-09-02 ENCOUNTER — Other Ambulatory Visit: Payer: Self-pay

## 2020-09-02 ENCOUNTER — Encounter: Payer: Self-pay | Admitting: Family Medicine

## 2020-09-02 VITALS — BP 110/62 | HR 91 | Temp 97.9°F | Ht 64.0 in | Wt 140.0 lb

## 2020-09-02 DIAGNOSIS — Z113 Encounter for screening for infections with a predominantly sexual mode of transmission: Secondary | ICD-10-CM | POA: Diagnosis not present

## 2020-09-02 DIAGNOSIS — Z7689 Persons encountering health services in other specified circumstances: Secondary | ICD-10-CM | POA: Diagnosis not present

## 2020-09-02 DIAGNOSIS — E063 Autoimmune thyroiditis: Secondary | ICD-10-CM | POA: Diagnosis not present

## 2020-09-02 DIAGNOSIS — Z0000 Encounter for general adult medical examination without abnormal findings: Secondary | ICD-10-CM

## 2020-09-02 DIAGNOSIS — Z1331 Encounter for screening for depression: Secondary | ICD-10-CM

## 2020-09-02 NOTE — Patient Instructions (Signed)
Good to see you today  If you are feeling overwhelmed, down, anxious- your student health center has a lot of resources and someone available 24/7  If there is anything I can help you with, please follow up- can be virtual or send me a mychart message  Pap is due after you are 20  Will send you a message about your labs and I will forward thryoid labs to Dr. Merton Border

## 2020-09-02 NOTE — Progress Notes (Signed)
Subjective:    Patient ID: Kristina Shaw, female    DOB: 25-Oct-2001, 19 y.o.   MRN: [id number]  HPI Chief Complaint  Patient presents with  . New Patient (Initial Visit)  . Annual Exam   This is a 19 yo female who presents today to establish care and for CPE. 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   Last CPE- previously seen at [facility] Tdap- no documentation, she is not sure, will pull [facility] Flu- declines Covid 19 vaccine- vaccinated Eye- within last 2  years Dental-regular Exercise-walking  Sex-not currently in an intimate relationship, has not been tested for STDs, is okay to be tested today.  Depression- has scored high on PHQ9 today, she reports that she currently is under a lot of stress related to college.  She will be graduating in the spring and computer science and is not sure what her next steps will be.  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  She is considering grad school and looking at her options.  She does not feel like her parents are able to help much because they did not go to college.  She does not find the college staff to be very helpful.  Hypothyroidism- has been followed by endocrine, Dr. Janeann Merl, currently on levothyroxine 0.75 mg daily.   Review of Systems  Constitutional: Negative.   HENT: Negative.   Eyes: Negative.   Respiratory: Negative.   Cardiovascular: Negative.   Gastrointestinal: Negative.   Endocrine: Negative.   Genitourinary: Negative.   Musculoskeletal: Negative.   Skin: Negative.   Allergic/Immunologic: Negative.   Neurological: Positive for headaches (occasional).  Hematological: Negative.   Psychiatric/Behavioral: Positive for dysphoric mood. Negative for self-injury (denies SI).    Objective:   Physical Exam Physical Exam  Constitutional: She is oriented to person, place, and time. She appears well-developed and well-nourished. No distress.  HENT:  Head: Normocephalic and atraumatic.   Right Ear: External ear normal. TM normal.  Left Ear: External ear normal. TM normal.  Nose: Nose normal.  Mouth/Throat: Oropharynx is clear and moist. No oropharyngeal exudate.  Eyes: Conjunctivae are normal.   Neck: Normal range of motion. Neck supple. No JVD present. No thyromegaly present.  Cardiovascular: Normal rate, regular rhythm, normal heart sounds and intact distal pulses.   Pulmonary/Chest: Effort normal and breath sounds normal. Right breast exhibits no inverted nipple, no mass, no nipple discharge, no skin change and no tenderness. Left breast exhibits no inverted nipple, no mass, no nipple discharge, no skin change and no tenderness. Breasts are symmetrical.  Abdominal: Soft. Bowel sounds are normal. She exhibits no distension and no mass. There is no tenderness. There is no rebound and no guarding.  Musculoskeletal: Normal range of motion. She exhibits no edema or tenderness.  Lymphadenopathy:    She has no cervical adenopathy.  Neurological: She is alert and oriented to person, place, and time.   Skin: Skin is warm and dry. She is not diaphoretic.  Psychiatric: She has a normal mood and affect. Her behavior is normal. Judgment and thought content normal.  Vitals reviewed.    Pulse 91   LMP  (LMP Unknown) Comment: pt on birth control. Last period 1 yr ago  SpO2 98%   BP Readings from Last 3 Encounters:  09/02/20 110/62  04/22/20 126/74  02/20/20 114/68    Wt Readings from Last 3 Encounters:  04/22/20 139 lb 6.4 oz (63.2 kg)  02/20/20 141 lb  9.6 oz (64.2 kg)  11/30/19 147 lb 9.6 oz (67 kg)       Depression screen [facility] 2/9 09/02/2020  Decreased Interest 3  Down, Depressed, Hopeless 1  PHQ - 2 Score 4  Altered sleeping 3  Tired, decreased energy 3  Change in appetite 3  Feeling bad or failure about yourself  1  Trouble concentrating 3  Moving slowly or fidgety/restless 0  Suicidal thoughts 0  PHQ-9 Score 17  Difficult doing work/chores Somewhat difficult    GAD 7 : Generalized Anxiety Score 09/02/2020  Nervous, Anxious, on Edge 1  Control/stop worrying 1  Worry too much - different things 1  Trouble relaxing 0  Restless 0  Easily annoyed or irritable 0  Afraid - awful might happen 0  Total GAD 7 Score 3  Anxiety Difficulty Not difficult at all     Assessment & Plan:  1. Encounter to establish care -Reviewed available records in EMR, will try to get immunization records  2. Hypothyroidism, acquired, autoimmune - TSH - T4, Free  3. Routine screening for STI (sexually transmitted infection) - RPR - HIV Antibody (routine testing w rflx) - C. trachomatis/N. gonorrhoeae RNA - Trichomonas vaginalis, RNA  4. Positive depression screening -Discussed symptoms with patient.  She is not interested in starting medication at this time.  Discussed available resources available to her on campus and encouraged her to look into counseling.  Discussed ability to follow-up as needed with virtual visits if she is out of town.  This visit occurred during the SARS-CoV-2 public health emergency.  Safety protocols were in place, including screening questions prior to the visit, additional usage of staff PPE, and extensive cleaning of exam room while observing appropriate contact time as indicated for disinfecting solutions.      Olean Ree, FNP-BC  [facility], [facility]  09/05/2020 12:18 PM

## 2020-09-03 LAB — RPR: RPR Ser Ql: NONREACTIVE

## 2020-09-03 LAB — TRICHOMONAS VAGINALIS, PROBE AMP: Trichomonas vaginalis RNA: NOT DETECTED

## 2020-09-03 LAB — C. TRACHOMATIS/N. GONORRHOEAE RNA
C. trachomatis RNA, TMA: NOT DETECTED
N. gonorrhoeae RNA, TMA: NOT DETECTED

## 2020-09-03 LAB — HIV ANTIBODY (ROUTINE TESTING W REFLEX): HIV 1&2 Ab, 4th Generation: NONREACTIVE

## 2020-09-03 LAB — TSH: TSH: 5.2 u[IU]/mL (ref 0.35–5.50)

## 2020-09-03 LAB — T4, FREE: Free T4: 0.69 ng/dL (ref 0.60–1.60)

## 2020-09-05 ENCOUNTER — Encounter: Payer: Self-pay | Admitting: Family Medicine

## 2020-09-06 ENCOUNTER — Encounter: Payer: Self-pay | Admitting: Family Medicine

## 2020-09-09 NOTE — Progress Notes (Signed)
Thank you :)

## 2020-09-24 ENCOUNTER — Other Ambulatory Visit: Payer: Self-pay

## 2020-09-24 ENCOUNTER — Ambulatory Visit (INDEPENDENT_AMBULATORY_CARE_PROVIDER_SITE_OTHER): Payer: Medicaid Other

## 2020-09-24 DIAGNOSIS — Z3042 Encounter for surveillance of injectable contraceptive: Secondary | ICD-10-CM

## 2020-09-24 DIAGNOSIS — Z3049 Encounter for surveillance of other contraceptives: Secondary | ICD-10-CM

## 2020-09-24 MED ORDER — MEDROXYPROGESTERONE ACETATE 150 MG/ML IM SUSP
150.0000 mg | Freq: Once | INTRAMUSCULAR | Status: AC
  Administered 2020-09-24: 150 mg via INTRAMUSCULAR

## 2020-09-24 NOTE — Progress Notes (Signed)
Pt presents for depo injection. Pt within depo window, no urine hcg needed. Injection given, tolerated well. F/u depo injection visit scheduled.   

## 2020-11-12 ENCOUNTER — Other Ambulatory Visit: Payer: Medicaid Other

## 2020-12-10 ENCOUNTER — Ambulatory Visit: Payer: Medicaid Other

## 2020-12-16 ENCOUNTER — Other Ambulatory Visit: Payer: Self-pay

## 2020-12-16 ENCOUNTER — Ambulatory Visit (INDEPENDENT_AMBULATORY_CARE_PROVIDER_SITE_OTHER): Payer: 59

## 2020-12-16 DIAGNOSIS — Z3042 Encounter for surveillance of injectable contraceptive: Secondary | ICD-10-CM

## 2020-12-16 MED ORDER — MEDROXYPROGESTERONE ACETATE 150 MG/ML IM SUSP
150.0000 mg | Freq: Once | INTRAMUSCULAR | Status: AC
  Administered 2020-12-16: 150 mg via INTRAMUSCULAR

## 2020-12-16 NOTE — Progress Notes (Signed)
Pt presents for depo injection. Pt within depo window, no urine hcg needed. Injection given, tolerated well. F/u depo injection visit scheduled.   

## 2021-03-03 ENCOUNTER — Other Ambulatory Visit: Payer: Self-pay

## 2021-03-03 ENCOUNTER — Ambulatory Visit (INDEPENDENT_AMBULATORY_CARE_PROVIDER_SITE_OTHER): Payer: Self-pay

## 2021-03-03 DIAGNOSIS — Z3042 Encounter for surveillance of injectable contraceptive: Secondary | ICD-10-CM

## 2021-03-03 MED ORDER — MEDROXYPROGESTERONE ACETATE 150 MG/ML IM SUSP
150.0000 mg | Freq: Once | INTRAMUSCULAR | Status: AC
Start: 2021-03-03 — End: 2021-03-03
  Administered 2021-03-03: 150 mg via INTRAMUSCULAR

## 2021-03-03 NOTE — Progress Notes (Addendum)
Pt presents for depo injection. Pt within depo window, no urine hcg needed. Injection given, tolerated well. F/u depo injection visit scheduled.   Pt will send new med insurance via MyChart please bill current insurance [facility])

## 2021-04-08 ENCOUNTER — Ambulatory Visit (INDEPENDENT_AMBULATORY_CARE_PROVIDER_SITE_OTHER): Payer: Self-pay | Admitting: Pediatric Endocrinology

## 2021-04-22 ENCOUNTER — Telehealth (INDEPENDENT_AMBULATORY_CARE_PROVIDER_SITE_OTHER): Payer: Self-pay | Admitting: Pediatric Endocrinology

## 2021-04-22 NOTE — Telephone Encounter (Signed)
  Who's calling (name and relationship to patient) : Kristina Shaw (Self) Best contact number: [phone] (Home) Provider they see: Dessa Phi, MD Reason for call: Please contact patient with when blood work should be completed    PRESCRIPTION REFILL ONLY  Name of prescription:  Pharmacy:

## 2021-04-23 ENCOUNTER — Ambulatory Visit (INDEPENDENT_AMBULATORY_CARE_PROVIDER_SITE_OTHER): Payer: Self-pay | Admitting: Pediatric Endocrinology

## 2021-04-23 ENCOUNTER — Other Ambulatory Visit (INDEPENDENT_AMBULATORY_CARE_PROVIDER_SITE_OTHER): Payer: Self-pay | Admitting: Pediatric Endocrinology

## 2021-04-23 DIAGNOSIS — E063 Autoimmune thyroiditis: Secondary | ICD-10-CM

## 2021-04-23 NOTE — Telephone Encounter (Signed)
Patient has been informed and expressed understanding.  

## 2021-05-13 ENCOUNTER — Other Ambulatory Visit (INDEPENDENT_AMBULATORY_CARE_PROVIDER_SITE_OTHER): Payer: Self-pay

## 2021-05-13 DIAGNOSIS — E063 Autoimmune thyroiditis: Secondary | ICD-10-CM

## 2021-05-13 LAB — T4, FREE: Free T4: 0.9 ng/dL (ref 0.8–1.8)

## 2021-05-13 LAB — T4: T4, Total: 6.3 ug/dL (ref 5.1–11.9)

## 2021-05-13 LAB — TSH: TSH: 7.8 mIU/L — ABNORMAL HIGH

## 2021-05-13 NOTE — Progress Notes (Signed)
Subjective:  Subjective  Patient Name: Kristina Shaw Date of Birth: May 18, 2001  MRN: [id number]  Kristina Shaw  Presents today for follow-up evaluation and management of her abnormal thyroid function tests, hypovitamin d and obesity   HISTORY OF PRESENT ILLNESS:   Kristina Shaw is a 20 y.o. AA female   Kristina Shaw was unaccompanied   1. Kristina Shaw was seen by her pcp in august 2014 for her [facility]. At that visit they obtained annual labs which were concerning for TSH of 9.6 with borderline free T4 of 0.81 and a vit D level of 14ng/dL. A1C, cholesterol, and CMP were all normal. She was referred to endocrinology for further evaluation and management of her abnormal blood work. She started Synthroid in September 2014 when TSH was 12.7.  She has a history of very high thyroid antibodies.   2. The patient's last [facility] visit was on 06/24/20. In the interim, she has been ok.   She has graduated college. She is working at [employer]. 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   She feels that she has been consistent with taking her Synthroid 75 mcg daily. However, she feels more symptomatic.   She is feeling fatigue, heat intolerance, constipation.   She is on Depo Provera and does not have periods.   She is looking for a new PCP as her provider just retired.   3. Pertinent Review of Systems:  Constitutional: The patient feels "okay". The patient seems healthy and active.  Eyes: having issues with distance vision. Glasses.  Neck: The patient has no complaints of anterior neck swelling, soreness, tenderness, pressure, discomfort, or difficulty swallowing.   Heart: Heart rate increases with exercise or other physical activity. The patient has no complaints of palpitations, irregular heart beats, chest pain, or chest pressure.   Lungs: no asthma or wheezing Gastrointestinal: Bowel movents seem normal. The patient has no complaints of excessive hunger, acid reflux, upset stomach, stomach aches  or pains, diarrhea, or constipation.  Legs: Muscle mass and strength seem normal. There are no complaints of numbness, tingling, burning, or pain. No edema is noted.  Feet: There are no obvious foot problems. There are no complaints of numbness, tingling, burning, or pain. No edema is noted. Neurologic: There are no recognized problems with muscle movement and strength, sensation, or coordination. GYN/GU: Now on depot provera- no menses.  Skin: no issues  Covid: She is vaccinated.   PAST MEDICAL, FAMILY, AND SOCIAL HISTORY  Past Medical History:  Diagnosis Date   H/O otitis media    Seasonal allergies    Thyroid condition    thyroid problem   Thyroid disease    Phreesia 06/24/2020   UTI (urinary tract infection)     Family History  Problem Relation Age of Onset   Obesity Mother    Hyperlipidemia Mother    Hypertension Maternal Grandmother    Diabetes Maternal Grandmother    Hyperlipidemia Maternal Grandmother    Allergic rhinitis Father    Early death Paternal Grandmother    Heart disease Paternal Grandmother    Eczema Sister    Eczema Brother    Thyroid disease Neg Hx      Current Outpatient Medications:    medroxyPROGESTERone Acetate (DEPO-PROVERA IM), Inject into the muscle., Disp: , Rfl:    cetirizine (ZYRTEC) 10 MG tablet, Take 1 tablet (10 mg total) by mouth daily. (Patient not taking: Reported on 05/14/2021), Disp: 30 tablet, Rfl: 5   levothyroxine (SYNTHROID) 88 MCG tablet,  Take 1 tablet (88 mcg total) by mouth daily., Disp: 90 tablet, Rfl: 1  Allergies as of 05/14/2021   (No Known Allergies)     reports that she has never smoked. She has been exposed to tobacco smoke. She has never used smokeless tobacco. She reports current alcohol use. She reports that she does not use drugs. 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    Graduated college. Living with mom.    Primary Care Provider: Emi Belfast, FNP  ROS: There are no other significant problems involving Kristina Shaw's other body systems.    Objective:  Objective  Vital Signs:    BP 122/60   Pulse 76   Wt 141 lb 3.2 oz (64 kg)   BMI 24.24 kg/m   Ht Readings from Last 3 Encounters:  09/02/20 5\' 4"  (1.626 m)  11/30/19 5' 4.17" (1.63 m)  08/09/19 5' 3.78" (1.62 m) (42 %, Z= -0.20)*   * Growth percentiles are based on CDC (Girls, 2-20 Years) data.   Wt Readings from Last 3 Encounters:  05/14/21 141 lb 3.2 oz (64 kg)  09/02/20 140 lb (63.5 kg)  04/22/20 139 lb 6.4 oz (63.2 kg)   HC Readings from Last 3 Encounters:  No data found for [facility]   Body surface area is 1.7 meters squared. Facility age limit for growth percentiles is 20 years. Facility age limit for growth percentiles is 20 years.   PHYSICAL EXAM:   Constitutional: The patient appears healthy and well nourished.  Weight is stable.  Head: The head is normocephalic. No areas of alopecia noted.  Face: The face appears normal. There are no obvious dysmorphic features. Eyes: The eyes appear to be normally formed and spaced. Gaze is conjugate. There is no obvious arcus or proptosis. Moisture appears normal. Ears: The ears are normally placed and appear externally normal. Mouth: The oropharynx and tongue appear normal. Dentition appears to be normal for age. Oral moisture is normal. Neck: The neck appears to be visibly normal. The thyroid gland is 14 grams in size. The consistency of the thyroid gland is normal. The thyroid gland is not tender to palpation. Lungs: No increased work of breathing.  Heart: regular pulses and peripheral perfusion Abdomen: The abdomen appears to be normal in size for the patient's age.  There is no obvious hepatomegaly, splenomegaly, or other mass effect.  Arms: Muscle size and bulk are normal for  age. Hands: There is no obvious tremor. Phalangeal and metacarpophalangeal joints are normal. Palmar muscles are normal for age. Palmar skin is normal. Palmar moisture is also normal. Legs: Muscles appear normal for age. No edema is present. Feet: Feet are normally formed. Dorsalis pedal pulses are normal. Neurologic: Strength is normal for age in both the upper and lower extremities. Muscle tone is normal. Sensation to touch is normal in both the legs and feet.   GYN/GU: normal   LAB DATA:    Pending  Results for orders placed or performed in visit on 05/13/21 (from the past 672 hour(s))  T4, free   Collection Time: 05/13/21  9:18 AM  Result Value Ref Range   Free T4 0.9 0.8 - 1.8 ng/dL  T4   Collection Time: 05/13/21  9:18 AM  Result Value Ref Range   T4, Total 6.3 5.1 -  11.9 mcg/dL  TSH   Collection Time: 05/13/21  9:18 AM  Result Value Ref Range   TSH 7.80 (H) mIU/L      Assessment and Plan:  Assessment  ASSESSMENT:  Zettie is a 20 y.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    Thyroid  - she has antibodies for both hypo and hyper thyroidism - she is currently clinically and chemically more hypothyroid.  - She is currently taking 75 mcg of Synthroid - She has previously been very sensitive to dose changes - Will increase dose to 88 mcg daily.  - Repeat labs in 6 weeks. (orders in for [facility]) - suspect heat intolerance/ "hot flashes" are related to depo provera and not thyroid related   PLAN:   1. Diagnostic: TFTs in 4-6 weeks ([facility]) 2. Therapeutic: Synthroid increase to 88 mcg 3. Patient education: Discussions as above.  4. Follow-up: Return in about 6 months (around 11/14/2021). Ok to cancel this appointment without reschedule if she has found an appropriate adult provider. If she has not found an adult provider will refer to adult endocrinology at this follow up appointment.      Dessa Phi, MD  Level of service: Level 3

## 2021-05-14 ENCOUNTER — Encounter (INDEPENDENT_AMBULATORY_CARE_PROVIDER_SITE_OTHER): Payer: Self-pay | Admitting: Pediatric Endocrinology

## 2021-05-14 ENCOUNTER — Ambulatory Visit (INDEPENDENT_AMBULATORY_CARE_PROVIDER_SITE_OTHER): Payer: 59 | Admitting: Pediatric Endocrinology

## 2021-05-14 ENCOUNTER — Other Ambulatory Visit: Payer: Self-pay

## 2021-05-14 DIAGNOSIS — E063 Autoimmune thyroiditis: Secondary | ICD-10-CM | POA: Diagnosis not present

## 2021-05-14 MED ORDER — LEVOTHYROXINE SODIUM 88 MCG PO TABS: 88.0000 ug | ORAL_TABLET | Freq: Every day | ORAL | 1 refills | Status: AC

## 2021-05-14 NOTE — Patient Instructions (Signed)
   Increase Synthroid to 88 mcg daily.   Repeat labs in 4-6 weeks (week after labor day!)

## 2021-05-19 ENCOUNTER — Ambulatory Visit: Payer: 59

## 2021-05-20 ENCOUNTER — Ambulatory Visit (INDEPENDENT_AMBULATORY_CARE_PROVIDER_SITE_OTHER): Payer: 59

## 2021-05-20 ENCOUNTER — Other Ambulatory Visit: Payer: Self-pay

## 2021-05-20 DIAGNOSIS — Z3042 Encounter for surveillance of injectable contraceptive: Secondary | ICD-10-CM | POA: Diagnosis not present

## 2021-05-20 MED ORDER — MEDROXYPROGESTERONE ACETATE 150 MG/ML IM SUSP
150.0000 mg | Freq: Once | INTRAMUSCULAR | Status: AC
Start: 2021-05-20 — End: 2021-05-20
  Administered 2021-05-20: 150 mg via INTRAMUSCULAR

## 2021-05-20 NOTE — Progress Notes (Signed)
Pt presents for depo injection. Pt within depo window, no urine hcg needed. Injection given, tolerated well. F/u depo injection visit scheduled.   

## 2021-08-05 ENCOUNTER — Ambulatory Visit: Payer: 59

## 2021-08-12 ENCOUNTER — Ambulatory Visit (INDEPENDENT_AMBULATORY_CARE_PROVIDER_SITE_OTHER): Payer: 59 | Admitting: Nurse Practitioner

## 2021-08-12 ENCOUNTER — Other Ambulatory Visit: Payer: Self-pay

## 2021-08-12 ENCOUNTER — Encounter: Payer: Self-pay | Admitting: Nurse Practitioner

## 2021-08-12 VITALS — BP 104/62 | HR 97 | Temp 97.7°F | Resp 10 | Ht 64.0 in | Wt 139.0 lb

## 2021-08-12 DIAGNOSIS — F411 Generalized anxiety disorder: Secondary | ICD-10-CM | POA: Diagnosis not present

## 2021-08-12 DIAGNOSIS — E063 Autoimmune thyroiditis: Secondary | ICD-10-CM

## 2021-08-12 DIAGNOSIS — Z79899 Other long term (current) drug therapy: Secondary | ICD-10-CM | POA: Diagnosis not present

## 2021-08-12 DIAGNOSIS — R002 Palpitations: Secondary | ICD-10-CM

## 2021-08-12 DIAGNOSIS — Z3042 Encounter for surveillance of injectable contraceptive: Secondary | ICD-10-CM | POA: Diagnosis not present

## 2021-08-12 LAB — POCT URINE PREGNANCY: Preg Test, Ur: NEGATIVE

## 2021-08-12 MED ORDER — MEDROXYPROGESTERONE ACETATE 150 MG/ML IM SUSP
150.0000 mg | Freq: Once | INTRAMUSCULAR | Status: AC
  Administered 2021-08-12: 150 mg via INTRAMUSCULAR

## 2021-08-12 MED ORDER — HYDROXYZINE PAMOATE 25 MG PO CAPS: 25.0000 mg | ORAL_CAPSULE | Freq: Two times a day (BID) | ORAL | 1 refills | Status: AC | PRN

## 2021-08-12 NOTE — Patient Instructions (Signed)
Nice to see you  Sent referrals Will see you in a few months for your physical

## 2021-08-12 NOTE — Assessment & Plan Note (Signed)
Patient currently managed by pediatric endocrinology.  Currently on levothyroxine 88 mcg/day.  States that she needs to transition to adult endocrinologist.  Will refer to endocrine for patient.  Patient has an appointment in January with pediatric endocrinologist if she does not get it in time with the doctor.  Keep appointment

## 2021-08-12 NOTE — Progress Notes (Signed)
Established Patient Office Visit  Subjective:  Patient ID: Kristina Shaw, female    DOB: 2001-01-17  Age: 20 y.o. MRN: [id number]  CC:  Chief Complaint  Patient presents with   Transfer of Care   Contraception    Would like to discuss getting Depo injections here now. She was going to [facility] but it is now out of her network with insurance. Last Depo injection on 05/20/21    HPI Kristina Shaw presents for [facility]  Depo Prevara: Has been almost a year. No periods. Did have some mood changes. No big weight change. Gyn referral. No current having incourse does practice safe sex  Graduated May 2022. Work [employer] and travels most time.  Still live at home with mom and dad. No partner at this time.    Past Medical History:  Diagnosis Date   H/O otitis media    Seasonal allergies    Thyroid condition    thyroid problem   Thyroid disease    Phreesia 06/24/2020   UTI (urinary tract infection)     Past Surgical History:  Procedure Laterality Date   HERNIA REPAIR     TONSILLECTOMY AND ADENOIDECTOMY     TONSILLECTOMY AND ADENOIDECTOMY     2006   TOOTH EXTRACTION  11/03/2017   no infections, or prolonged hospital stays    TYMPANOSTOMY TUBE PLACEMENT      Family History  Problem Relation Age of Onset   Obesity Mother    Hyperlipidemia Mother    Hypertension Maternal Grandmother    Diabetes Maternal Grandmother    Hyperlipidemia Maternal Grandmother    Allergic rhinitis Father    Early death Paternal Grandmother    Heart disease Paternal Grandmother    Eczema Sister    Eczema Brother    Thyroid disease Neg Hx     Social History   Socioeconomic History   Marital status: Single    Spouse name: Not on file   Number of children: Not on file   Years of education: Not on file   Highest education level: Not on file  Occupational History   Not on file  Tobacco Use   Smoking status: Never    Passive exposure: Past   Smokeless  tobacco: Never  Substance and Sexual Activity   Alcohol use: Yes   Drug use: No   Sexual activity: Not on file  Other Topics Concern   Not on file  Social History Narrative   Lives with mom, dad, 1 sister, 1 brother   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    Social Determinants of Health   Financial Resource Strain: Not on file  Food Insecurity: Not on file  Transportation Needs: Not on file  Physical Activity: Not on file  Stress: Not on file  Social Connections: Not on file  Intimate Partner Violence: Not on file    Outpatient Medications Prior to Visit  Medication Sig Dispense Refill   levothyroxine (SYNTHROID) 88 MCG tablet Take 1 tablet (88 mcg total) by mouth daily. 90 tablet 1   medroxyPROGESTERone Acetate (DEPO-PROVERA IM) Inject into the muscle.     cetirizine (ZYRTEC) 10 MG tablet Take 1 tablet (10 mg total) by mouth daily. (Patient not taking: Reported on 05/14/2021) 30 tablet 5   No facility-administered medications prior to visit.    No Known Allergies  ROS Review of Systems  Constitutional:  Negative for chills, fatigue and fever.  Eyes:  Negative for visual disturbance (glasses).  Respiratory:  Negative for cough.   Cardiovascular:  Negative for chest pain, palpitations and leg swelling.  Gastrointestinal:  Positive for constipation. Negative for diarrhea, nausea and vomiting.  Genitourinary:  Negative for dysuria, hematuria, vaginal bleeding, vaginal discharge and vaginal pain.  Neurological:  Negative for headaches.  Psychiatric/Behavioral:  Negative for hallucinations, self-injury and suicidal ideas.      Objective:    Physical Exam Vitals and nursing note reviewed.  Constitutional:      Appearance: Normal appearance.  HENT:     Right Ear: Ear canal and external ear normal. There is no impacted cerumen.     Left Ear: Ear canal and external ear normal. There is no impacted cerumen.     Mouth/Throat:     Mouth: Mucous membranes are moist.      Pharynx: Oropharynx is clear.  Eyes:     Extraocular Movements: Extraocular movements intact.     Pupils: Pupils are equal, round, and reactive to light.     Comments: Wears glasses  Neck:     Thyroid: No thyroid mass, thyromegaly or thyroid tenderness.  Cardiovascular:     Rate and Rhythm: Normal rate and regular rhythm.  Pulmonary:     Effort: Pulmonary effort is normal.     Breath sounds: Normal breath sounds.  Abdominal:     General: Bowel sounds are normal.  Musculoskeletal:     Right lower leg: No edema.     Left lower leg: No edema.  Lymphadenopathy:     Cervical: No cervical adenopathy.  Skin:    General: Skin is warm.  Neurological:     Mental Status: She is alert.     Deep Tendon Reflexes:     Reflex Scores:      Bicep reflexes are 2+ on the right side and 2+ on the left side.      Patellar reflexes are 2+ on the right side and 2+ on the left side.    Comments: Bilateral upper and lower extremity strength 5/5  Psychiatric:        Mood and Affect: Mood normal.        Behavior: Behavior normal.        Thought Content: Thought content normal.        Judgment: Judgment normal.   PHQ9 SCORE ONLY 08/12/2021 09/02/2020  PHQ-9 Total Score 15 17    GAD 7 : Generalized Anxiety Score 08/12/2021 09/02/2020  Nervous, Anxious, on Edge 3 1  Control/stop worrying 3 1  Worry too much - different things 2 1  Trouble relaxing 2 0  Restless 0 0  Easily annoyed or irritable 2 0  Afraid - awful might happen 0 0  Total GAD 7 Score 12 3  Anxiety Difficulty Somewhat difficult Not difficult at all      BP 104/62   Pulse 97   Temp 97.7 F (36.5 C)   Resp 10   Ht 5\' 4"  (1.626 m)   Wt 139 lb (63 kg)   SpO2 98%   BMI 23.86 kg/m  Wt Readings from Last 3 Encounters:  08/12/21 139 lb (63 kg)  05/14/21 141 lb 3.2 oz (64 kg)  09/02/20 140 lb (63.5 kg)     Health Maintenance Due  Topic Date Due   HPV VACCINES (1 - 2-dose series) Never done   Hepatitis C Screening  Never  done   COVID-19 Vaccine (3 - Booster for Pfizer series) 09/04/2020   PAP-Cervical Cytology Screening  Never done  PAP SMEAR-Modifier  Never done   TETANUS/TDAP  01/29/2021       Topic Date Due   HPV VACCINES (1 - 2-dose series) Never done    Lab Results  Component Value Date   TSH 7.80 (H) 05/13/2021   Lab Results  Component Value Date   WBC 5.7 04/19/2020   HGB 12.7 04/19/2020   HCT 39.5 04/19/2020   MCV 86.4 04/19/2020   PLT 206 04/19/2020   Lab Results  Component Value Date   NA 139 04/19/2020   K 4.6 04/19/2020   CO2 25 04/19/2020   GLUCOSE 76 04/19/2020   BUN 5 (L) 04/19/2020   CREATININE 0.64 04/19/2020   BILITOT 1.3 (H) 04/19/2020   AST 13 04/19/2020   ALT 4 (L) 04/19/2020   PROT 6.9 04/19/2020   CALCIUM 9.9 04/19/2020   No results found for: CHOL No results found for: HDL No results found for: LDLCALC No results found for: TRIG No results found for: CHOLHDL Lab Results  Component Value Date   HGBA1C 5.1 03/31/2019      Assessment & Plan:   Problem List Items Addressed This Visit       Endocrine   Hypothyroidism, acquired, autoimmune    Patient currently managed by pediatric endocrinology.  Currently on levothyroxine 88 mcg/day.  States that she needs to transition to adult endocrinologist.  Will refer to endocrine for patient.  Patient has an appointment in January with pediatric endocrinologist if she does not get it in time with the doctor.  Keep appointment      Relevant Orders   Ambulatory referral to Endocrinology     Other   Encounter for surveillance of injectable contraceptive   Relevant Orders   Ambulatory referral to Gynecology   Palpitations    Question patient states she no longer has palpitations that was tied to her thyroid thyroids well managed she no longer has issues.      GAD (generalized anxiety disorder)    Patient did fill out PHQ-9 and GAD-7.  PHQ-9 had improved since last visit with previous provider.  Did discuss  options with patient states that she did do counseling/therapy when she went to college and it did help some discussed different options including maintenance medication.  Use we will start with hydroxyzine as patient feels like it is more anxiety related versus depression will have follow-up via MyChart in 1 month patient follow-up with me in office in 3. Start hydroxyzine 25 mg twice daily as needed anxiety      Relevant Medications   hydrOXYzine (VISTARIL) 25 MG capsule   Other Visit Diagnoses     High risk medication use    -  Primary   Relevant Orders   POCT urine pregnancy (Completed)       No orders of the defined types were placed in this encounter.   Follow-up: Return in about 12 weeks (around 11/04/2021) for CPE and recheck. labs same day.  This visit occurred during the SARS-CoV-2 public health emergency.  Safety protocols were in place, including screening questions prior to the visit, additional usage of staff PPE, and extensive cleaning of exam room while observing appropriate contact time as indicated for disinfecting solutions.     Audria Nine, NP

## 2021-08-12 NOTE — Assessment & Plan Note (Signed)
Question patient states she no longer has palpitations that was tied to her thyroid thyroids well managed she no longer has issues.

## 2021-08-12 NOTE — Assessment & Plan Note (Signed)
Patient did fill out PHQ-9 and GAD-7.  PHQ-9 had improved since last visit with previous provider.  Did discuss options with patient states that she did do counseling/therapy when she went to college and it did help some discussed different options including maintenance medication.  Use we will start with hydroxyzine as patient feels like it is more anxiety related versus depression will have follow-up via MyChart in 1 month patient follow-up with me in office in 3. Start hydroxyzine 25 mg twice daily as needed anxiety

## 2021-11-10 ENCOUNTER — Other Ambulatory Visit (HOSPITAL_COMMUNITY)
Admission: RE | Admit: 2021-11-10 | Discharge: 2021-11-10 | Disposition: A | Discharge status: Home / Self Care | Payer: Commercial Managed Care - HMO | Source: Ambulatory Visit | Attending: Nurse Practitioner | Admitting: Nurse Practitioner

## 2021-11-10 ENCOUNTER — Encounter: Payer: Self-pay | Admitting: Nurse Practitioner

## 2021-11-10 ENCOUNTER — Other Ambulatory Visit: Payer: Self-pay

## 2021-11-10 ENCOUNTER — Ambulatory Visit (INDEPENDENT_AMBULATORY_CARE_PROVIDER_SITE_OTHER): Payer: Managed Care, Other (non HMO) | Admitting: Nurse Practitioner

## 2021-11-10 VITALS — BP 108/66 | HR 95 | Temp 98.3°F | Resp 10 | Ht 64.0 in | Wt 147.1 lb

## 2021-11-10 DIAGNOSIS — Z124 Encounter for screening for malignant neoplasm of cervix: Secondary | ICD-10-CM | POA: Insufficient documentation

## 2021-11-10 DIAGNOSIS — Z23 Encounter for immunization: Secondary | ICD-10-CM | POA: Diagnosis not present

## 2021-11-10 DIAGNOSIS — E063 Autoimmune thyroiditis: Secondary | ICD-10-CM

## 2021-11-10 DIAGNOSIS — N898 Other specified noninflammatory disorders of vagina: Secondary | ICD-10-CM

## 2021-11-10 DIAGNOSIS — Z0000 Encounter for general adult medical examination without abnormal findings: Secondary | ICD-10-CM | POA: Diagnosis not present

## 2021-11-10 DIAGNOSIS — Z3042 Encounter for surveillance of injectable contraceptive: Secondary | ICD-10-CM

## 2021-11-10 DIAGNOSIS — Z113 Encounter for screening for infections with a predominantly sexual mode of transmission: Secondary | ICD-10-CM

## 2021-11-10 LAB — COMPREHENSIVE METABOLIC PANEL
ALT: 24 U/L (ref 0–35)
AST: 22 U/L (ref 0–37)
Albumin: 4.1 g/dL (ref 3.5–5.2)
Alkaline Phosphatase: 57 U/L (ref 39–117)
BUN: 9 mg/dL (ref 6–23)
CO2: 27 mEq/L (ref 19–32)
Calcium: 9.1 mg/dL (ref 8.4–10.5)
Chloride: 103 mEq/L (ref 96–112)
Creatinine, Ser: 0.71 mg/dL (ref 0.40–1.20)
GFR: 121.06 mL/min (ref >60.00)
Glucose, Bld: 70 mg/dL (ref 70–99)
Potassium: 4 mEq/L (ref 3.5–5.1)
Sodium: 138 mEq/L (ref 135–145)
Total Bilirubin: 1 mg/dL (ref 0.2–1.2)
Total Protein: 6.7 g/dL (ref 6.0–8.3)

## 2021-11-10 LAB — CBC WITH DIFFERENTIAL/PLATELET
Basophils Absolute: 0 10*3/uL (ref 0.0–0.1)
Basophils Relative: 0.5 % (ref 0.0–3.0)
Eosinophils Absolute: 0.2 10*3/uL (ref 0.0–0.7)
Eosinophils Relative: 2.1 % (ref 0.0–5.0)
HCT: 39.2 % (ref 36.0–46.0)
Hemoglobin: 12.5 g/dL (ref 12.0–15.0)
Lymphocytes Relative: 30.5 % (ref 12.0–46.0)
Lymphs Abs: 2.5 10*3/uL (ref 0.7–4.0)
MCHC: 32 g/dL (ref 30.0–36.0)
MCV: 83.4 fl (ref 78.0–100.0)
Monocytes Absolute: 0.7 10*3/uL (ref 0.1–1.0)
Monocytes Relative: 9.1 % (ref 3.0–12.0)
Neutro Abs: 4.7 10*3/uL (ref 1.4–7.7)
Neutrophils Relative %: 57.8 % (ref 43.0–77.0)
Platelets: 240 10*3/uL (ref 150.0–400.0)
RBC: 4.71 Mil/uL (ref 3.87–5.11)
RDW: 14.4 % (ref 11.5–15.5)
WBC: 8.2 10*3/uL (ref 4.0–10.5)

## 2021-11-10 LAB — LIPID PANEL
Cholesterol: 121 mg/dL (ref 0–200)
HDL: 62.9 mg/dL (ref >39.00)
LDL Cholesterol: 53 mg/dL (ref 0–99)
NonHDL: 58.41
Total CHOL/HDL Ratio: 2
Triglycerides: 28 mg/dL (ref 0.0–149.0)
VLDL: 5.6 mg/dL (ref 0.0–40.0)

## 2021-11-10 LAB — TSH: TSH: 7.1 u[IU]/mL — ABNORMAL HIGH (ref 0.35–5.50)

## 2021-11-10 LAB — T4, FREE: Free T4: 0.78 ng/dL (ref 0.60–1.60)

## 2021-11-10 MED ORDER — MEDROXYPROGESTERONE ACETATE 150 MG/ML IM SUSP
150.0000 mg | Freq: Once | INTRAMUSCULAR | Status: AC
  Administered 2021-11-10: 150 mg via INTRAMUSCULAR

## 2021-11-10 NOTE — Patient Instructions (Signed)
Nice to see you today I will be in touch in regards to your lab results Congratulations on the move and new job. Can follow up with me until you get a new PCP in [place]

## 2021-11-10 NOTE — Assessment & Plan Note (Signed)
Patient due for Depo-Provera  injection today.  Administered in office

## 2021-11-10 NOTE — Assessment & Plan Note (Signed)
Incidental finding on exam.  Wet prep pending

## 2021-11-10 NOTE — Assessment & Plan Note (Signed)
Did review age-appropriate screenings immunizations with patient.  Continue working on lifestyle modifications and healthy living

## 2021-11-10 NOTE — Assessment & Plan Note (Signed)
Was followed by pediatric neurology.  Patient has aged out referral was placed for endocrinology patient improving. Located in [place].  We will check labs today continue following up here as needed until established with new primary care nephrologist in [place].

## 2021-11-10 NOTE — Assessment & Plan Note (Signed)
Patient here for her yearly exam routine STD screening

## 2021-11-10 NOTE — Progress Notes (Signed)
Established Patient Office Visit  Subjective:  Patient ID: Kristina Shaw, female    DOB: May 28, 2001  Age: 21 y.o. MRN: [id number]  CC:  Chief Complaint  Patient presents with   Annual Exam    HPI Kaydan Mancias presents for complete physical and follow up of chronic conditions.  Immunizations: -Tetanus: Due today -Influenza: 07/29/2021 -Covid-19: pfizer x2 and 1 booster -Shingles: NA -Pneumonia: NA  -HPV: Unsure  Diet: Fair diet. Twice daily meals with snacks in between. Water and sometime cranberry juice.. Occ wine once every couple months Exercise:  4 days weekly. Weight lifting and cardio. Workouts at least an hour  Eye exam: Completes annually 2022 Dental exam: Completes semi-annually   Pap Smear: Completed in Never. Sexually active. Uses Depo provera and uses condoms Mammogram: monthly self checks Dexa: NA Colonoscopy: NA   Lung Cancer Screening: NA    States that she has been using the hydroxyzine as needed and has seen an improvement.  Was referred to adult endocrine and did not hear anything. She has moved to [place] and will establish care there.  PHQ9 SCORE ONLY 08/12/2021 09/02/2020  PHQ-9 Total Score 15 17   GAD 7 : Generalized Anxiety Score 08/12/2021 09/02/2020  Nervous, Anxious, on Edge 3 1  Control/stop worrying 3 1  Worry too much - different things 2 1  Trouble relaxing 2 0  Restless 0 0  Easily annoyed or irritable 2 0  Afraid - awful might happen 0 0  Total GAD 7 Score 12 3  Anxiety Difficulty Somewhat difficult Not difficult at all      Past Medical History:  Diagnosis Date   H/O otitis media    Seasonal allergies    Thyroid condition    thyroid problem   Thyroid disease    Phreesia 06/24/2020   UTI (urinary tract infection)     Past Surgical History:  Procedure Laterality Date   HERNIA REPAIR     TONSILLECTOMY AND ADENOIDECTOMY     TONSILLECTOMY AND ADENOIDECTOMY     2006   TOOTH EXTRACTION  11/03/2017   no  infections, or prolonged hospital stays    TYMPANOSTOMY TUBE PLACEMENT      Family History  Problem Relation Age of Onset   Diabetes Mother    Obesity Mother    Hyperlipidemia Mother    Allergic rhinitis Father    Eczema Sister    Eczema Brother    Cancer Maternal Aunt        2x breast ca   Hypertension Maternal Grandmother    Diabetes Maternal Grandmother    Hyperlipidemia Maternal Grandmother    Early death Paternal Grandmother        early 46s heart realted   Heart disease Paternal Grandmother    Cancer Cousin        lung ca both living   Thyroid disease Neg Hx     Social History   Socioeconomic History   Marital status: Single    Spouse name: Not on file   Number of children: Not on file   Years of education: Not on file   Highest education level: Not on file  Occupational History   Not on file  Tobacco Use   Smoking status: Never    Passive exposure: Past   Smokeless tobacco: Never  Substance and Sexual Activity   Alcohol use: Yes    Comment: socially drinks wine   Drug use: No   Sexual activity: Not on file    Comment:  Depo prevera  Other Topics Concern   Not on file  Social History Narrative   Lives with mom, dad, 1 sister, 1 brother   Graduated with [employer] and works for a [employer]. Still lives at home   Social Determinants of Health   Financial Resource Strain: Not on file  Food Insecurity: Not on file  Transportation Needs: Not on file  Physical Activity: Not on file  Stress: Not on file  Social Connections: Not on file  Intimate Partner Violence: Not on file    Outpatient Medications Prior to Visit  Medication Sig Dispense Refill   hydrOXYzine (VISTARIL) 25 MG capsule Take 1 capsule (25 mg total) by mouth 2 (two) times daily as needed. 60 capsule 1   levothyroxine (SYNTHROID) 88 MCG tablet Take 1 tablet (88 mcg total) by mouth daily. 90 tablet 1   medroxyPROGESTERone Acetate (DEPO-PROVERA IM) Inject into the muscle.      No facility-administered medications prior to visit.    No Known Allergies  ROS Review of Systems  Constitutional:  Negative for chills, fatigue and fever.  Eyes:  Negative for visual disturbance.  Respiratory:  Negative for cough and shortness of breath.   Cardiovascular:  Negative for chest pain, palpitations and leg swelling.  Gastrointestinal:  Negative for abdominal pain, diarrhea, nausea and vomiting.       Bm daily  Genitourinary:  Negative for dysuria, frequency, hematuria, vaginal bleeding, vaginal discharge and vaginal pain.  Neurological:  Negative for dizziness, weakness, light-headedness, numbness and headaches (not daily noticed with change in her diet).  Psychiatric/Behavioral:  Negative for hallucinations and suicidal ideas.      Objective:    Physical Exam Vitals and nursing note reviewed. Exam conducted with a chaperone present [facility], RMA).  Constitutional:      Appearance: Normal appearance.  HENT:     Right Ear: Ear canal and external ear normal.     Left Ear: Tympanic membrane, ear canal and external ear normal.     Mouth/Throat:     Mouth: Mucous membranes are moist.  Eyes:     Pupils: Pupils are equal, round, and reactive to light.     Comments: Wears corrective lenses   Neck:     Thyroid: No thyroid mass, thyromegaly or thyroid tenderness.  Cardiovascular:     Rate and Rhythm: Normal rate and regular rhythm.     Pulses: Normal pulses.     Heart sounds: Normal heart sounds.  Pulmonary:     Effort: Pulmonary effort is normal.     Breath sounds: Normal breath sounds.  Chest:  Breasts:    Right: Normal.     Left: Normal.  Abdominal:     General: Bowel sounds are normal. There is no distension.     Palpations: There is no mass.     Tenderness: There is no abdominal tenderness.  Genitourinary:    Exam position: Lithotomy position.     Vagina: Normal.     Cervix: Discharge present. No cervical motion tenderness or friability.      Uterus: Normal.      Adnexa: Right adnexa normal and left adnexa normal.     Comments: Thick white discharge. Patient states that is normal when she is ovulating  Musculoskeletal:     Right lower leg: No edema.     Left lower leg: No edema.  Lymphadenopathy:     Cervical: No cervical adenopathy.     Upper Body:     Right  upper body: No supraclavicular, axillary or pectoral adenopathy.     Left upper body: No supraclavicular, axillary or pectoral adenopathy.  Skin:    General: Skin is warm.  Neurological:     General: No focal deficit present.     Mental Status: She is alert.  Psychiatric:        Mood and Affect: Mood normal.        Behavior: Behavior normal.        Thought Content: Thought content normal.        Judgment: Judgment normal.    BP 108/66    Pulse 95    Temp 98.3 F (36.8 C)    Resp 10    Ht 5\' 4"  (1.626 m)    Wt 147 lb 2 oz (66.7 kg)    SpO2 99%    BMI 25.25 kg/m  Wt Readings from Last 3 Encounters:  11/10/21 147 lb 2 oz (66.7 kg)  08/12/21 139 lb (63 kg)  05/14/21 141 lb 3.2 oz (64 kg)     Health Maintenance Due  Topic Date Due   Pneumococcal Vaccine 60-23 Years old (1 - PCV) Never done   HPV VACCINES (1 - 2-dose series) Never done   Hepatitis C Screening  Never done   PAP-Cervical Cytology Screening  Never done   PAP SMEAR-Modifier  Never done   TETANUS/TDAP  01/29/2021   COVID-19 Vaccine (4 - Booster for Pfizer series) 09/23/2021       Topic Date Due   HPV VACCINES (1 - 2-dose series) Never done    Lab Results  Component Value Date   TSH 7.80 (H) 05/13/2021   Lab Results  Component Value Date   WBC 5.7 04/19/2020   HGB 12.7 04/19/2020   HCT 39.5 04/19/2020   MCV 86.4 04/19/2020   PLT 206 04/19/2020   Lab Results  Component Value Date   NA 139 04/19/2020   K 4.6 04/19/2020   CO2 25 04/19/2020   GLUCOSE 76 04/19/2020   BUN 5 (L) 04/19/2020   CREATININE 0.64 04/19/2020   BILITOT 1.3 (H) 04/19/2020   AST 13 04/19/2020   ALT 4 (L)  04/19/2020   PROT 6.9 04/19/2020   CALCIUM 9.9 04/19/2020   No results found for: CHOL No results found for: HDL No results found for: LDLCALC No results found for: TRIG No results found for: CHOLHDL Lab Results  Component Value Date   HGBA1C 5.1 03/31/2019      Assessment & Plan:   Problem List Items Addressed This Visit       Endocrine   Hypothyroidism, acquired, autoimmune    Was followed by pediatric neurology.  Patient has aged out referral was placed for endocrinology patient improving. Located in [place].  We will check labs today continue following up here as needed until established with new primary care nephrologist in [place].      Relevant Orders   T4, free (Completed)   TSH (Completed)     Other   Encounter for surveillance of injectable contraceptive    Patient due for Depo-Provera  injection today.  Administered in office      Preventative health care - Primary    Did review age-appropriate screenings immunizations with patient.  Continue working on lifestyle modifications and healthy living      Relevant Orders   CBC with Differential/Platelet (Completed)   Comprehensive metabolic panel (Completed)   Lipid panel (Completed)   Screening for STD (sexually transmitted disease)  Patient here for her yearly exam routine STD screening      Relevant Orders   WET PREP BY MOLECULAR PROBE   C. trachomatis/N. gonorrhoeae RNA   Vaginal discharge    Incidental finding on exam.  Wet prep pending      Relevant Orders   WET PREP BY MOLECULAR PROBE   Other Visit Diagnoses     Screening for cervical cancer       Relevant Orders   Cytology - PAP([facility])   Need for tetanus booster       Relevant Orders   Tdap vaccine greater than or equal to 7yo IM (Completed)       Meds ordered this encounter  Medications   medroxyPROGESTERone (DEPO-PROVERA) injection 150 mg    Follow-up: No follow-ups on file.   This visit occurred during the  SARS-CoV-2 public health emergency.  Safety protocols were in place, including screening questions prior to the visit, additional usage of staff PPE, and extensive cleaning of exam room while observing appropriate contact time as indicated for disinfecting solutions.   Romilda Garret, NP

## 2021-11-11 LAB — WET PREP BY MOLECULAR PROBE
Candida species: NOT DETECTED
Gardnerella vaginalis: NOT DETECTED
MICRO NUMBER:: 12875449
SPECIMEN QUALITY:: ADEQUATE
Trichomonas vaginosis: NOT DETECTED

## 2021-11-11 LAB — C. TRACHOMATIS/N. GONORRHOEAE RNA
C. trachomatis RNA, TMA: NOT DETECTED
N. gonorrhoeae RNA, TMA: NOT DETECTED

## 2021-11-12 ENCOUNTER — Encounter: Payer: 59 | Admitting: Nurse Practitioner

## 2021-11-12 LAB — CYTOLOGY - PAP

## 2021-11-13 ENCOUNTER — Encounter: Payer: Self-pay | Admitting: Nurse Practitioner

## 2021-11-13 ENCOUNTER — Other Ambulatory Visit: Payer: Self-pay | Admitting: Nurse Practitioner

## 2021-11-13 ENCOUNTER — Ambulatory Visit (INDEPENDENT_AMBULATORY_CARE_PROVIDER_SITE_OTHER): Payer: 59 | Admitting: Pediatric Endocrinology

## 2021-11-13 DIAGNOSIS — R87612 Low grade squamous intraepithelial lesion on cytologic smear of cervix (LGSIL): Secondary | ICD-10-CM

## 2021-11-18 ENCOUNTER — Telehealth: Payer: Self-pay

## 2021-11-18 NOTE — Telephone Encounter (Signed)
Called to inform the pt of the appt on 12/23/21 @ 915am

## 2021-12-23 ENCOUNTER — Ambulatory Visit: Payer: Managed Care, Other (non HMO) | Admitting: Obstetrics & Gynecology

## 2021-12-23 ENCOUNTER — Encounter: Payer: Self-pay | Admitting: Obstetrics & Gynecology

## 2021-12-23 ENCOUNTER — Other Ambulatory Visit: Payer: Self-pay

## 2021-12-23 VITALS — BP 115/76 | HR 69 | Wt 146.4 lb

## 2021-12-23 DIAGNOSIS — R87612 Low grade squamous intraepithelial lesion on cytologic smear of cervix (LGSIL): Secondary | ICD-10-CM | POA: Diagnosis not present

## 2021-12-23 DIAGNOSIS — Z7185 Encounter for immunization safety counseling: Secondary | ICD-10-CM | POA: Diagnosis not present

## 2021-12-23 NOTE — Progress Notes (Signed)
° °  GYNECOLOGY OFFICE VISIT NOTE  History:   Kristina Shaw is a 21 y.o. G0 here today for discuss management of recent LGSIL pap smear on 11/10/21, was done at her PCP's office. She denies any abnormal vaginal discharge, bleeding, pelvic pain or other concerns.    Past Medical History:  Diagnosis Date   H/O otitis media    Seasonal allergies    Thyroid condition    thyroid problem   Thyroid disease    Phreesia 06/24/2020   UTI (urinary tract infection)     Past Surgical History:  Procedure Laterality Date   HERNIA REPAIR     TONSILLECTOMY AND ADENOIDECTOMY     TONSILLECTOMY AND ADENOIDECTOMY     2006   TOOTH EXTRACTION  11/03/2017   no infections, or prolonged hospital stays    TYMPANOSTOMY TUBE PLACEMENT      The following portions of the patient's history were reviewed and updated as appropriate: allergies, current medications, past family history, past medical history, past social history, past surgical history and problem list.   Review of Systems:  Pertinent items noted in HPI and remainder of comprehensive ROS otherwise negative.  Physical Exam:  BP 115/76    Pulse 69    Wt 146 lb 6.4 oz (66.4 kg)    LMP 12/14/2021 (Exact Date)    BMI 25.13 kg/m  CONSTITUTIONAL: Well-developed, well-nourished female in no acute distress.  HEENT:  Normocephalic, atraumatic. External right and left ear normal. No scleral icterus.  NECK: Normal range of motion, supple, no masses noted on observation SKIN: No rash noted. Not diaphoretic. No erythema. No pallor. MUSCULOSKELETAL: Normal range of motion. No edema noted. NEUROLOGIC: Alert and oriented to person, place, and time. Normal muscle tone coordination. No cranial nerve deficit noted. PSYCHIATRIC: Normal mood and affect. Normal behavior. Normal judgment and thought content. CARDIOVASCULAR: Normal heart rate noted RESPIRATORY: Effort and breath sounds normal, no problems with respiration noted ABDOMEN: No masses noted. No other overt  distention noted.   PELVIC: Deferred     Assessment and Plan:     1. 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 HPV vaccine counseling Discussed results of pap. Discussed etiology of cervical dysplasia and association with HPV, patient has not been vaccinated.  HPV vaccine recommended, she will consider this. Information given to her at home. As for the pap smear, she just needs to repeat pap in one year as per ASCCP guidelines, no further intervention needed for now. It was explained that this is due to high level of clearance of HPV/low grade cervical dysplasia in patients younger than 25.  She was reassured.  Answered other questions about basic gynecology care for patient. Routine preventative health maintenance measures emphasized. Please refer to After Visit Summary for other counseling recommendations.   Return in about 1 year (around 12/23/2022) for Repeat pap test.    I spent 20 minutes dedicated to the care of this patient including pre-visit review of records, face to face time with the patient discussing her conditions and treatments and post visit orders.    Jaynie Collins, MD, FACOG Obstetrician & Gynecologist, [facility] for [employer], [facility]

## 2022-01-13 ENCOUNTER — Other Ambulatory Visit: Payer: Self-pay | Admitting: *Deleted

## 2022-01-13 MED ORDER — MEDROXYPROGESTERONE ACETATE 150 MG/ML IM SUSP: 150.0000 mg | INTRAMUSCULAR | 2 refills | Status: AC

## 2022-01-29 ENCOUNTER — Ambulatory Visit (INDEPENDENT_AMBULATORY_CARE_PROVIDER_SITE_OTHER): Payer: Managed Care, Other (non HMO) | Admitting: *Deleted

## 2022-01-29 VITALS — BP 109/57 | HR 90

## 2022-01-29 DIAGNOSIS — Z3042 Encounter for surveillance of injectable contraceptive: Secondary | ICD-10-CM

## 2022-01-29 DIAGNOSIS — Z23 Encounter for immunization: Secondary | ICD-10-CM

## 2022-01-29 MED ORDER — MEDROXYPROGESTERONE ACETATE 150 MG/ML IM SUSP
150.0000 mg | Freq: Once | INTRAMUSCULAR | Status: AC
Start: 2022-01-29 — End: 2022-01-29
  Administered 2022-01-29: 150 mg via INTRAMUSCULAR

## 2022-01-29 NOTE — Progress Notes (Signed)
ATTESTATION OF SUPERVISION OF RN: Evaluation and management procedures were performed by the RN under my supervision and collaboration. I have reviewed the nursing note and chart and agree with the management and plan for this patient.  Alphonso Gregson, CNM  

## 2022-01-29 NOTE — Progress Notes (Cosign Needed)
Date last pap: 11/10/21. ?Last Depo-Provera: 11/10/21. ?Side Effects if any: .none ?Serum HCG indicated? NA. ?Depo-Provera 150 mg IM given by: Crosby Oyster, RN . ?Next appointment due Jun 22-July 6.  ? ?Pt also received first Gardasil injection. Pt tolerated injection well, will follow up in 2 month for nest dose.  ? ?Crosby Oyster, RN  ?

## 2022-04-20 ENCOUNTER — Ambulatory Visit: Payer: Managed Care, Other (non HMO)

## 2022-08-10 ENCOUNTER — Encounter: Payer: Self-pay | Admitting: Nurse Practitioner

## 2022-08-10 DIAGNOSIS — E063 Autoimmune thyroiditis: Secondary | ICD-10-CM

## 2023-04-30 ENCOUNTER — Encounter (INDEPENDENT_AMBULATORY_CARE_PROVIDER_SITE_OTHER): Payer: Self-pay
